# Patient Record
Sex: Female | Born: 1965 | Race: Black or African American | Hispanic: No | Marital: Single | Smoking: Never smoker
Health system: Southern US, Community
[De-identification: ages and names within clinical notes are randomized; demographics above are authoritative.]

## PROBLEM LIST (undated history)

## (undated) DIAGNOSIS — F410 Panic disorder [episodic paroxysmal anxiety] without agoraphobia: Secondary | ICD-10-CM

## (undated) HISTORY — PX: TUBAL LIGATION: SHX77

---

## 2010-08-29 ENCOUNTER — Emergency Department (INDEPENDENT_AMBULATORY_CARE_PROVIDER_SITE_OTHER): Payer: Medicaid Other

## 2010-08-29 ENCOUNTER — Encounter: Payer: Self-pay | Admitting: Family Medicine

## 2010-08-29 ENCOUNTER — Emergency Department (HOSPITAL_BASED_OUTPATIENT_CLINIC_OR_DEPARTMENT_OTHER)
Admission: EM | Admit: 2010-08-29 | Discharge: 2010-08-29 | Disposition: A | Discharge status: Home / Self Care | Payer: Medicaid Other | Attending: Emergency Medicine | Admitting: Emergency Medicine

## 2010-08-29 DIAGNOSIS — A499 Bacterial infection, unspecified: Secondary | ICD-10-CM | POA: Insufficient documentation

## 2010-08-29 DIAGNOSIS — E119 Type 2 diabetes mellitus without complications: Secondary | ICD-10-CM | POA: Insufficient documentation

## 2010-08-29 DIAGNOSIS — R109 Unspecified abdominal pain: Secondary | ICD-10-CM

## 2010-08-29 DIAGNOSIS — D259 Leiomyoma of uterus, unspecified: Secondary | ICD-10-CM

## 2010-08-29 DIAGNOSIS — B9689 Other specified bacterial agents as the cause of diseases classified elsewhere: Secondary | ICD-10-CM

## 2010-08-29 DIAGNOSIS — N760 Acute vaginitis: Secondary | ICD-10-CM | POA: Insufficient documentation

## 2010-08-29 HISTORY — DX: Panic disorder (episodic paroxysmal anxiety): F41.0

## 2010-08-29 LAB — URINALYSIS, ROUTINE W REFLEX MICROSCOPIC
Glucose, UA: 100 mg/dL — AB
Hgb urine dipstick: NEGATIVE
Ketones, ur: NEGATIVE mg/dL
Protein, ur: NEGATIVE mg/dL
pH: 6 (ref 5.0–8.0)

## 2010-08-29 LAB — WET PREP, GENITAL: Yeast Wet Prep HPF POC: NONE SEEN

## 2010-08-29 LAB — PREGNANCY, URINE: Preg Test, Ur: NEGATIVE

## 2010-08-29 MED ORDER — METRONIDAZOLE 500 MG PO TABS: 500.0000 mg | ORAL_TABLET | Freq: Two times a day (BID) | ORAL | Status: AC

## 2010-08-29 MED ORDER — HYDROCODONE-ACETAMINOPHEN 5-325 MG PO TABS: 2.0000 | ORAL_TABLET | ORAL | Status: AC | PRN

## 2010-08-29 NOTE — ED Notes (Signed)
MD at bedside. 

## 2010-08-29 NOTE — ED Notes (Signed)
Pt drove self to ED today. Pt unable to provide urine specimen at this time.

## 2010-08-29 NOTE — ED Provider Notes (Signed)
History     CSN: [id number] Arrival date & time: 08/29/2010  1:43 PM  Chief Complaint  Patient presents with  . Back Pain   Patient is a 45 y.o. female presenting with back pain and abdominal pain.  Back Pain  This is a recurrent problem. The current episode started more than 1 week ago. The problem occurs constantly. The problem has been gradually worsening. The pain is associated with no known injury. The pain is present in the lumbar spine. The quality of the pain is described as stabbing. Associated symptoms include abdominal pain.  Abdominal Pain The primary symptoms of the illness include abdominal pain. The current episode started more than 2 days ago. The onset of the illness was gradual. The problem has been rapidly worsening.  The illness is associated with recent sexual activity. The patient states that she believes she is currently not pregnant. The patient has not had a change in bowel habit. Additional symptoms associated with the illness include back pain. Symptoms associated with the illness do not include chills, anorexia, diaphoresis, heartburn or constipation. Significant associated medical issues do not include inflammatory bowel disease, diabetes, gallstones, liver disease or diverticulitis.   patient complains of pelvic pain worse with intercourse for the past month. Patient reports she has not had a Pap smear in over 3 years... she does not currently have a gynecologist. Patient has been told in the past that she has fibroids. She denies any risk of any sexually transmitted disease partner has not had any symptoms.  Past Medical History  Diagnosis Date  . Diabetes mellitus   . Panic attacks     Past Surgical History  Procedure Date  . Tubal ligation     No family history on file.  History  Substance Use Topics  . Smoking status: Never Smoker   . Smokeless tobacco: Not on file  . Alcohol Use: Yes    OB History    Grav Para Term Preterm Abortions TAB SAB Ect  Mult Living                  Review of Systems  Constitutional: Negative for chills and diaphoresis.  Gastrointestinal: Positive for abdominal pain. Negative for heartburn, constipation and anorexia.  Musculoskeletal: Positive for back pain.  All other systems reviewed and are negative.    Physical Exam  BP 124/69  Pulse 70  Temp(Src) 98.2 F (36.8 C) (Oral)  Resp 16  Ht 5\' 11"  (1.803 m)  Wt 230 lb (104.327 kg)  BMI 32.08 kg/m2  SpO2 100%  LMP 04/29/2010  Physical Exam  Constitutional: She appears well-developed and well-nourished.  HENT:  Head: Normocephalic.  Eyes: Pupils are equal, round, and reactive to light.  Neck: Normal range of motion.  Cardiovascular: Normal rate.   Pulmonary/Chest: Effort normal.  Abdominal: Soft.  Genitourinary: Vagina normal.       Uterus is enlarged there is a thick brownish yellow vaginal discharge. Cervix is tender bilateral adnexa are nontender.    ED Course  Procedures  MDM Wet prep shows many clue cells and ultrasound shows a 12 x 2 cm myometrial fibroid. I will place the patient on Flagyl and given hydrocodone for pain we'll refer the patient to gynecology for complete evaluation     Langston Masker, [place] 08/29/10 1724  Medical screening examination/treatment/procedure(s) were conducted as a shared visit with non-physician practitioner(s) and myself.  I personally evaluated the patient during the encounter   Sunnie Nielsen, MD 08/30/10 0010

## 2010-08-29 NOTE — ED Notes (Signed)
Pt c/o right lower back pain, suprapubic pain since Saturday. Pt reports hematuria on 07/28 but not since. Pt sts she went to [facility] today and was told "it's a muscle strain" and given "muscle relaxers".

## 2015-01-14 DIAGNOSIS — IMO0002 Reserved for concepts with insufficient information to code with codable children: Secondary | ICD-10-CM | POA: Diagnosis present

## 2015-05-21 DIAGNOSIS — I10 Essential (primary) hypertension: Secondary | ICD-10-CM | POA: Diagnosis present

## 2015-05-29 ENCOUNTER — Emergency Department (HOSPITAL_BASED_OUTPATIENT_CLINIC_OR_DEPARTMENT_OTHER): Payer: Medicaid Other

## 2015-05-29 ENCOUNTER — Encounter (HOSPITAL_BASED_OUTPATIENT_CLINIC_OR_DEPARTMENT_OTHER): Payer: Self-pay | Admitting: *Deleted

## 2015-05-29 ENCOUNTER — Emergency Department (HOSPITAL_BASED_OUTPATIENT_CLINIC_OR_DEPARTMENT_OTHER)
Admission: EM | Admit: 2015-05-29 | Discharge: 2015-05-30 | Disposition: A | Discharge status: Home / Self Care | Payer: Medicaid Other | Attending: Emergency Medicine | Admitting: Emergency Medicine

## 2015-05-29 DIAGNOSIS — Z7984 Long term (current) use of oral hypoglycemic drugs: Secondary | ICD-10-CM | POA: Diagnosis not present

## 2015-05-29 DIAGNOSIS — E119 Type 2 diabetes mellitus without complications: Secondary | ICD-10-CM | POA: Diagnosis not present

## 2015-05-29 DIAGNOSIS — R51 Headache: Secondary | ICD-10-CM | POA: Insufficient documentation

## 2015-05-29 DIAGNOSIS — R079 Chest pain, unspecified: Secondary | ICD-10-CM | POA: Diagnosis present

## 2015-05-29 DIAGNOSIS — R0602 Shortness of breath: Secondary | ICD-10-CM | POA: Insufficient documentation

## 2015-05-29 DIAGNOSIS — R479 Unspecified speech disturbances: Secondary | ICD-10-CM | POA: Diagnosis not present

## 2015-05-29 LAB — COMPREHENSIVE METABOLIC PANEL
ALBUMIN: 3.4 g/dL — AB (ref 3.5–5.0)
ALK PHOS: 110 U/L (ref 38–126)
ALT: 13 U/L — ABNORMAL LOW (ref 14–54)
ANION GAP: 7 (ref 5–15)
AST: 15 U/L (ref 15–41)
BILIRUBIN TOTAL: 0.5 mg/dL (ref 0.3–1.2)
BUN: 16 mg/dL (ref 6–20)
CALCIUM: 9 mg/dL (ref 8.9–10.3)
CO2: 25 mmol/L (ref 22–32)
Chloride: 105 mmol/L (ref 101–111)
Creatinine, Ser: 0.81 mg/dL (ref 0.44–1.00)
GLUCOSE: 255 mg/dL — AB (ref 65–99)
POTASSIUM: 4.1 mmol/L (ref 3.5–5.1)
Sodium: 137 mmol/L (ref 135–145)
TOTAL PROTEIN: 6.6 g/dL (ref 6.5–8.1)

## 2015-05-29 LAB — ETHANOL: Alcohol, Ethyl (B): <5 mg/dL (ref <5)

## 2015-05-29 LAB — PREGNANCY, URINE: Preg Test, Ur: NEGATIVE

## 2015-05-29 LAB — CBC
HCT: 37.8 % (ref 36.0–46.0)
HEMOGLOBIN: 12.8 g/dL (ref 12.0–15.0)
MCH: 29.5 pg (ref 26.0–34.0)
MCHC: 33.9 g/dL (ref 30.0–36.0)
MCV: 87.1 fL (ref 78.0–100.0)
Platelets: 288 10*3/uL (ref 150–400)
RBC: 4.34 MIL/uL (ref 3.87–5.11)
RDW: 11.9 % (ref 11.5–15.5)
WBC: 8.7 10*3/uL (ref 4.0–10.5)

## 2015-05-29 LAB — RAPID URINE DRUG SCREEN, HOSP PERFORMED
AMPHETAMINES: NOT DETECTED
BENZODIAZEPINES: NOT DETECTED
Barbiturates: NOT DETECTED
COCAINE: NOT DETECTED
OPIATES: NOT DETECTED
Tetrahydrocannabinol: NOT DETECTED

## 2015-05-29 LAB — DIFFERENTIAL
Basophils Absolute: 0 10*3/uL (ref 0.0–0.1)
Basophils Relative: 0 %
EOS ABS: 0.2 10*3/uL (ref 0.0–0.7)
EOS PCT: 2 %
LYMPHS ABS: 2.7 10*3/uL (ref 0.7–4.0)
LYMPHS PCT: 31 %
MONO ABS: 0.7 10*3/uL (ref 0.1–1.0)
Monocytes Relative: 8 %
NEUTROS PCT: 59 %
Neutro Abs: 5.1 10*3/uL (ref 1.7–7.7)

## 2015-05-29 LAB — URINALYSIS, ROUTINE W REFLEX MICROSCOPIC
BILIRUBIN URINE: NEGATIVE
Glucose, UA: >1000 mg/dL — AB
HGB URINE DIPSTICK: NEGATIVE
Ketones, ur: NEGATIVE mg/dL
Leukocytes, UA: NEGATIVE
Nitrite: NEGATIVE
PH: 7 (ref 5.0–8.0)
Protein, ur: NEGATIVE mg/dL
SPECIFIC GRAVITY, URINE: 1.024 (ref 1.005–1.030)

## 2015-05-29 LAB — TROPONIN I: Troponin I: <0.03 ng/mL (ref <0.031)

## 2015-05-29 LAB — PROTIME-INR
INR: 0.96 (ref 0.00–1.49)
Prothrombin Time: 13 seconds (ref 11.6–15.2)

## 2015-05-29 LAB — URINE MICROSCOPIC-ADD ON: RBC / HPF: NONE SEEN RBC/hpf (ref 0–5)

## 2015-05-29 LAB — APTT: APTT: 28 s (ref 24–37)

## 2015-05-29 LAB — CBG MONITORING, ED: GLUCOSE-CAPILLARY: 201 mg/dL — AB (ref 65–99)

## 2015-05-29 MED ORDER — ACETAMINOPHEN 325 MG PO TABS
650.0000 mg | ORAL_TABLET | Freq: Once | ORAL | Status: AC
  Administered 2015-05-29: 650 mg via ORAL
  Filled 2015-05-29: qty 2

## 2015-05-29 NOTE — ED Notes (Addendum)
Sister states  c/o CP, slurred speech, last seen normal sat.

## 2015-05-29 NOTE — ED Notes (Signed)
Nurse first-pt assisted from car-was able to stand slowly and sit in w/c-adult female friend is with pt-pt speaking in slow exaggerated speech with friend states is not her normal-pt c/o SOB and CP x 4 hours-O2 sat 100%- HR 74- RR 18

## 2015-05-29 NOTE — ED Provider Notes (Signed)
CSN: [id number]     Arrival date & time 05/29/15  2018 History  By signing my name below, I, [name], attest that this documentation has been prepared under the direction and in the presence of physician practitioner, Sherwood Gambler, MD. Electronically Signed: [name], Scribe. 05/29/2015. 8:44 PM.     Chief Complaint  Patient presents with  . Chest Pain    The history is provided by the patient and a relative. No language interpreter was used.     HPI Comments: Brianna Wood is a 50 y.o. female brought in by her sister with h/o panic attacks who presents to the Emergency Department complaining of sudden onset, constant, severe, worsening, middle chest pain beginning this morning. Per her sister, pt notes associated slow and slurred speech, SOB, and pain in the back of her head that feels like pressure. Pt's sister saw her this evening and pt was in her current condition. She endorses chest pain exacerbation with palpation, talking, and arm raising. Per her sister, pt said that she felt like she was going to die and feels scared. Pt was last seen 4 days ago and was normal per her sister. Per her sister, pt was recently admitted at [facility] for similar, but worse, symptoms. Pt denies abdominal pain or any other associated symptoms.  Past Medical History  Diagnosis Date  . Diabetes mellitus   . Panic attacks    Past Surgical History  Procedure Laterality Date  . Tubal ligation     History reviewed. No pertinent family history. Social History  Substance Use Topics  . Smoking status: Never Smoker   . Smokeless tobacco: None  . Alcohol Use: Yes   OB History    No data available     Review of Systems  Respiratory: Positive for shortness of breath.   Cardiovascular: Positive for chest pain.  Gastrointestinal: Negative for abdominal pain.  Neurological: Positive for speech difficulty (slow, slurred) and headaches.  All other systems reviewed and are  negative.   Allergies  Aspirin  Home Medications   Prior to Admission medications   Medication Sig Start Date End Date Taking? Authorizing Provider  BusPIRone HCl (BUSPAR PO) Take by mouth as needed.      Historical Provider, MD  metFORMIN (GLUMETZA) 500 MG (MOD) 24 hr tablet Take 500 mg by mouth daily with breakfast.      Historical Provider, MD   BP 148/98 mmHg  Pulse 74  Temp(Src) 98.1 F (36.7 C)  Resp 16  Ht 5\' 10"  (1.778 m)  Wt 220 lb (99.791 kg)  BMI 31.57 kg/m2  SpO2 100% Physical Exam  Constitutional: She is oriented to person, place, and time. She appears well-developed and well-nourished. No distress.  HENT:  Head: Normocephalic and atraumatic.  Right Ear: External ear normal.  Left Ear: External ear normal.  Nose: Nose normal.  Eyes: EOM are normal. Pupils are equal, round, and reactive to light. Right eye exhibits no discharge. Left eye exhibits no discharge.  Neck: Normal range of motion. Neck supple.  Cardiovascular: Normal rate, regular rhythm and normal heart sounds.   Pulmonary/Chest: Effort normal and breath sounds normal. She exhibits tenderness (mid-chest).  Abdominal: Soft. She exhibits no distension. There is no tenderness.  Musculoskeletal: She exhibits no edema.  Neurological: She is alert and oriented to person, place, and time.  Speaks very slowly but is not slurring words. No facial droop. Diffusely weak but this seems effort related. The more she is coached up  the more she can move her arms and legs. She is able to sit up and remain sitting with no truncal ataxia  Skin: Skin is warm and dry. She is not diaphoretic.  Nursing note and vitals reviewed.   ED Course  Procedures (including critical care time)  DIAGNOSTIC STUDIES: Oxygen Saturation is 100% on RA, normal by my interpretation.    COORDINATION OF CARE: 8:48 PM Discussed treatment plan with pt at bedside and pt agreed to plan.  Labs Review Labs Reviewed  COMPREHENSIVE METABOLIC  PANEL - Abnormal; Notable for the following:    Glucose, Bld 255 (*)    Albumin 3.4 (*)    ALT 13 (*)    All other components within normal limits  CBG MONITORING, ED - Abnormal; Notable for the following:    Glucose-Capillary 201 (*)    All other components within normal limits  ETHANOL  PROTIME-INR  APTT  CBC  DIFFERENTIAL  TROPONIN I  URINE RAPID DRUG SCREEN, HOSP PERFORMED  URINALYSIS, ROUTINE W REFLEX MICROSCOPIC (NOT AT [facility])  PREGNANCY, URINE    Imaging Review Dg Chest 2 View  05/29/2015  CLINICAL DATA:  Midchest pain for several days. EXAM: CHEST  2 VIEW COMPARISON:  05/20/2015 FINDINGS: The lungs are clear. The pulmonary vasculature is normal. Heart size is normal. Hilar and mediastinal contours are unremarkable. There is no pleural effusion. IMPRESSION: No active cardiopulmonary disease. Electronically Signed   By: Andreas Newport M.D.   On: 05/29/2015 21:54   Ct Head Wo Contrast  05/29/2015  CLINICAL DATA:  50 year old with headache and exceedingly slow speech pattern over the past 10 days. EXAM: CT HEAD WITHOUT CONTRAST TECHNIQUE: Contiguous axial images were obtained from the base of the skull through the vertex without intravenous contrast. COMPARISON:  CT head 05/20/2015, 05/20/2010. MRI brain 05/08/2013, 05/06/2013. FINDINGS: Motion blurred many of the images initially. These were repeated with persistent motion. The study is therefore less than optimal though appears diagnostic. Ventricular system normal in size and appearance for age. No mass lesion. No midline shift. No acute hemorrhage or hematoma. No extra-axial fluid collections. No evidence of acute infarction. No focal brain parenchymal abnormality. No significant interval change. No skull fracture or other focal osseous abnormality involving the skull. Visualized paranasal sinuses, bilateral mastoid air cells and bilateral middle ear cavities well-aerated. IMPRESSION: Motion degraded examination demonstrates no acute  intracranial abnormality. Electronically Signed   By: Evangeline Dakin M.D.   On: 05/29/2015 21:55   I have personally reviewed and evaluated these images and lab results as part of my medical decision-making.   EKG Interpretation   Date/Time:  Wednesday May 29 2015 20:40:39 EDT Ventricular Rate:  69 PR Interval:  189 QRS Duration: 83 QT Interval:  386 QTC Calculation: 413 R Axis:   49 Text Interpretation:  Sinus rhythm Normal ECG No old tracing to compare  Confirmed by Nyimah Shadduck MD, Mars Scheaffer [phone]) on 05/29/2015 9:08:31 PM       MRI BRAIN HEAD WO CONTRAST4/25/2017  [facility]  Result Impression  IMPRESSION:  1. No acute intracranial abnormality. No evidence of acute infarct. 2. Small discrete white matter lesions are identified within the cerebral hemispheres. These lesions are nonspecific, usually resulting from benign/remote/incidental causes (e.g. prior trauma/inflammation/demyelination, or chronic ischemia associated  with migraine/atherosclerosis/other vasculopathies).   CT Chest Abdomen Pelvis W Contrast4/25/2017  [facility]  Result Impression  IMPRESSION:  1. Minimal bibasilar atelectasis 2. Small nonobstructing stone lower pole of the right kidney.   2D Echo 05/21/2015  Interpretation Summary The study was technically difficult.  Ejection Fraction = >55%. The left ventricular wall motion is normal. There is mild mitral regurgitation. There is mild tricuspid regurgitation.  MDM   Final diagnoses:  Chest pain, unspecified chest pain type  Speech abnormality    Patient's symptoms appear to be psychiatric in nature. There are no focal neuro deficits. I extensively reviewed her past chart in care everywhere and she was admitted about one week ago for very similar speech difficulty and chest pain. She had a CT scan that was negative and essentially negative MRI. She also had a CT chest abdomen pelvis that was negative. Her chest pain is quite reproducible. There  is no slurred speech, only the exaggerated slow speech. She was also seen on 5/1 for similar chest pain and slowed speech. Negative CT head then as well. I have very low suspicion this is ACS. Not consistent with PE with no hypoxia, increase work of breathing, or tachycardia. Recently had the negative CT chest abdomen pelvis and thus I think dissection is very unlikely as well. Patient tells me that she has a stress test tomorrow and a neurology follow-up in 2 days. She denies suicidal or homicidal thoughts. While I think this is psychiatric in nature, she does not meet any type of IVC criteria and I think she is stable for outpatient workup. She was able to ambulate in the ED without assistance.  I personally performed the services described in this documentation, which was scribed in my presence. The recorded information has been reviewed and is accurate.   Sherwood Gambler, MD 05/29/15 [phone]

## 2015-05-30 NOTE — ED Notes (Signed)
ambulated from bed to hall and pt gait steady with minimal assist

## 2015-08-29 DIAGNOSIS — I639 Cerebral infarction, unspecified: Secondary | ICD-10-CM | POA: Diagnosis present

## 2016-03-02 ENCOUNTER — Emergency Department (HOSPITAL_BASED_OUTPATIENT_CLINIC_OR_DEPARTMENT_OTHER)
Admission: EM | Admit: 2016-03-02 | Discharge: 2016-03-02 | Disposition: A | Discharge status: Home / Self Care | Payer: Medicaid Other | Attending: Emergency Medicine | Admitting: Emergency Medicine

## 2016-03-02 ENCOUNTER — Encounter (HOSPITAL_BASED_OUTPATIENT_CLINIC_OR_DEPARTMENT_OTHER): Payer: Self-pay | Admitting: *Deleted

## 2016-03-02 ENCOUNTER — Emergency Department (HOSPITAL_BASED_OUTPATIENT_CLINIC_OR_DEPARTMENT_OTHER): Payer: Medicaid Other

## 2016-03-02 DIAGNOSIS — M79604 Pain in right leg: Secondary | ICD-10-CM | POA: Diagnosis present

## 2016-03-02 DIAGNOSIS — Z7984 Long term (current) use of oral hypoglycemic drugs: Secondary | ICD-10-CM | POA: Diagnosis not present

## 2016-03-02 DIAGNOSIS — R739 Hyperglycemia, unspecified: Secondary | ICD-10-CM

## 2016-03-02 DIAGNOSIS — Z7901 Long term (current) use of anticoagulants: Secondary | ICD-10-CM | POA: Diagnosis not present

## 2016-03-02 DIAGNOSIS — E1165 Type 2 diabetes mellitus with hyperglycemia: Secondary | ICD-10-CM | POA: Diagnosis not present

## 2016-03-02 LAB — URINALYSIS, MICROSCOPIC (REFLEX): RBC / HPF: NONE SEEN RBC/hpf (ref 0–5)

## 2016-03-02 LAB — COMPREHENSIVE METABOLIC PANEL
ALT: 19 U/L (ref 14–54)
ANION GAP: 8 (ref 5–15)
AST: 19 U/L (ref 15–41)
Albumin: 4 g/dL (ref 3.5–5.0)
Alkaline Phosphatase: 120 U/L (ref 38–126)
BILIRUBIN TOTAL: 0.6 mg/dL (ref 0.3–1.2)
BUN: 17 mg/dL (ref 6–20)
CALCIUM: 9.6 mg/dL (ref 8.9–10.3)
CO2: 26 mmol/L (ref 22–32)
Chloride: 103 mmol/L (ref 101–111)
Creatinine, Ser: 0.81 mg/dL (ref 0.44–1.00)
GFR calc non Af Amer: >60 mL/min (ref >60)
Glucose, Bld: 265 mg/dL — ABNORMAL HIGH (ref 65–99)
POTASSIUM: 4 mmol/L (ref 3.5–5.1)
Sodium: 137 mmol/L (ref 135–145)
TOTAL PROTEIN: 7.6 g/dL (ref 6.5–8.1)

## 2016-03-02 LAB — URINALYSIS, ROUTINE W REFLEX MICROSCOPIC
BILIRUBIN URINE: NEGATIVE
Glucose, UA: >=500 mg/dL — AB
HGB URINE DIPSTICK: NEGATIVE
KETONES UR: NEGATIVE mg/dL
Leukocytes, UA: NEGATIVE
Nitrite: NEGATIVE
PROTEIN: NEGATIVE mg/dL
Specific Gravity, Urine: 1.028 (ref 1.005–1.030)
pH: 6 (ref 5.0–8.0)

## 2016-03-02 LAB — CBC WITH DIFFERENTIAL/PLATELET
Basophils Absolute: 0 10*3/uL (ref 0.0–0.1)
Basophils Relative: 0 %
EOS ABS: 0.1 10*3/uL (ref 0.0–0.7)
Eosinophils Relative: 2 %
HCT: 40.1 % (ref 36.0–46.0)
HEMOGLOBIN: 13.6 g/dL (ref 12.0–15.0)
LYMPHS ABS: 2.8 10*3/uL (ref 0.7–4.0)
Lymphocytes Relative: 30 %
MCH: 29.3 pg (ref 26.0–34.0)
MCHC: 33.9 g/dL (ref 30.0–36.0)
MCV: 86.4 fL (ref 78.0–100.0)
MONO ABS: 0.6 10*3/uL (ref 0.1–1.0)
MONOS PCT: 7 %
NEUTROS ABS: 5.8 10*3/uL (ref 1.7–7.7)
NEUTROS PCT: 61 %
Platelets: 303 10*3/uL (ref 150–400)
RBC: 4.64 MIL/uL (ref 3.87–5.11)
RDW: 12.9 % (ref 11.5–15.5)
WBC: 9.4 10*3/uL (ref 4.0–10.5)

## 2016-03-02 LAB — PROTIME-INR
INR: 0.92
PROTHROMBIN TIME: 12.4 s (ref 11.4–15.2)

## 2016-03-02 LAB — CBG MONITORING, ED: Glucose-Capillary: 242 mg/dL — ABNORMAL HIGH (ref 65–99)

## 2016-03-02 MED ORDER — FUROSEMIDE 20 MG PO TABS: 20.0000 mg | ORAL_TABLET | Freq: Every day | ORAL | 0 refills | Status: AC

## 2016-03-02 MED ORDER — GLIMEPIRIDE 4 MG PO TABS: 4.0000 mg | ORAL_TABLET | Freq: Every day | ORAL | 0 refills | Status: DC

## 2016-03-02 MED ORDER — TRAMADOL HCL 50 MG PO TABS: 50.0000 mg | ORAL_TABLET | Freq: Four times a day (QID) | ORAL | 0 refills | Status: AC | PRN

## 2016-03-02 MED FILL — FUROSEMIDE 20 MG TABLET: 20 | 2 days supply | Qty: 2 | Fill #0

## 2016-03-02 MED FILL — GLIMEPIRIDE 4 MG TABLET: 4 | 30 days supply | Qty: 30 | Fill #0

## 2016-03-02 MED FILL — traMADol HCL 50 MG TABS: 50 | 2 days supply | Qty: 8 | Fill #0

## 2016-03-02 NOTE — ED Provider Notes (Signed)
Brianna Wood DEPT [facility] Provider Note   CSN: [id number] Arrival date & time: 03/02/16  [id number]   By signing my name below, I, Brianna Wood, attest that this documentation has been prepared under the direction and in the presence of Brianna Freeze, Wood  Electronically Signed: Delton Wood, ED Scribe. 03/02/16. 3:08 PM.   History   Chief Complaint Chief Complaint  Patient presents with  . Leg Pain   The history is provided by the patient. No language interpreter was used.   HPI Comments:  Brianna Wood is a 51 y.o. female, with a hx of DM on glipizide and metformin and stroke on 04/2015, who presents to the Emergency Department complaining of moderate bilateral lower leg pain, from her knees down, onset 2 days. She states her right leg pain is worse than her left leg and describes her pain as a tightness. No alleviating factors noted. Pt denies chest pain, SOB or any other associated symptoms. Patient was on coumadin before but has been off of it for several months. She is on plavix after her stroke last year   PCP: Brianna Kid, Wood  Past Medical History:  Diagnosis Date  . Diabetes mellitus   . Panic attacks     There are no active problems to display for this patient.   Past Surgical History:  Procedure Laterality Date  . TUBAL LIGATION      OB History    No data available       Home Medications    Prior to Admission medications   Medication Sig Start Date End Date Taking? Authorizing Provider  GLIPIZIDE PO Take by mouth.   Yes Historical Provider, Wood  methocarbamol (ROBAXIN) 500 MG tablet Take 500 mg by mouth 4 (four) times daily.   Yes Historical Provider, Wood  Warfarin Sodium (COUMADIN PO) Take by mouth.   Yes Historical Provider, Wood  BusPIRone HCl (BUSPAR PO) Take by mouth as needed.      Historical Provider, Wood  metFORMIN (GLUMETZA) 500 MG (MOD) 24 hr tablet Take 500 mg by mouth daily with breakfast.      Historical Provider, Wood    Family History History  reviewed. No pertinent family history.  Social History Social History  Substance Use Topics  . Smoking status: Never Smoker  . Smokeless tobacco: Never Used  . Alcohol use Yes     Allergies   Aspirin   Review of Systems Review of Systems  Respiratory: Negative for shortness of breath.   Cardiovascular: Negative for chest pain.  Musculoskeletal: Positive for myalgias.  All other systems reviewed and are negative.    Physical Exam Updated Vital Signs BP 127/90 (BP Location: Left Arm)   Pulse 65   Temp 98.3 F (36.8 C) (Oral)   Resp 16   Ht 5\' 10"  (1.778 m)   Wt 230 lb (104.3 kg)   LMP 04/29/2010   SpO2 97%   BMI 33.00 kg/m   Physical Exam  Constitutional: She is oriented to person, place, and time. She appears well-developed and well-nourished. No distress.  HENT:  Head: Normocephalic and atraumatic.  Eyes: EOM are normal.  Neck: Normal range of motion.  Cardiovascular: Normal rate, regular rhythm and normal heart sounds.   Pulmonary/Chest: Effort normal and breath sounds normal.  Abdominal: Soft. She exhibits no distension. There is no tenderness.  Musculoskeletal: Normal range of motion. She exhibits tenderness. She exhibits no deformity.  Mild bilateral calf tenderness. Good pulses 2+ bilaterally, no obvious deformity. Trace edema  bilaterally   Neurological: She is alert and oriented to person, place, and time.  Normal strength throughout   Skin: Skin is warm and dry.  Psychiatric: She has a normal mood and affect. Judgment normal.  Nursing note and vitals reviewed.    ED Treatments / Results  DIAGNOSTIC STUDIES:  Oxygen Saturation is 97% on RA, normal by my interpretation.    COORDINATION OF CARE:  3:06 PM Discussed treatment plan with pt at bedside and pt agreed to plan.  Labs (all labs ordered are listed, but only abnormal results are displayed) Labs Reviewed  CBG MONITORING, ED - Abnormal; Notable for the following:       Result Value    Glucose-Capillary 242 (*)    All other components within normal limits  CBC WITH DIFFERENTIAL/PLATELET  COMPREHENSIVE METABOLIC PANEL  PROTIME-INR    EKG  EKG Interpretation  Date/Time:  Monday March 02 2016 13:50:07 EST Ventricular Rate:  60 PR Interval:    QRS Duration: 87 QT Interval:  408 QTC Calculation: 408 R Axis:   31 Text Interpretation:  Sinus rhythm No significant change since last tracing Confirmed by Brianna Wood, Brianna Wood [id number]) on 03/02/2016 2:08:45 PM       Radiology No results found.  Procedures Procedures (including critical care time)  Medications Ordered in ED Medications - No data to display   Initial Impression / Assessment and Plan / ED Course  I have reviewed the triage vital signs and the nursing notes.  Pertinent labs & imaging results that were available during my care of the patient were reviewed by me and considered in my medical decision making (see chart for details).     [name] is a 51 y.o. female here with bilateral calf pain, some swelling. She was on coumadin but has been off of it. Consider DVT vs diabetic neuropathy. Will get DVT study, labs.   5:28 PM Labs showed glucose 265, nl AG. US showed no DVT. Likely has neuropathy. She is taking metformin 1000 mg BID. Will increase glipimeride. Will give tramadol for pain, several doss of lasix for swelling.    Final Clinical Impressions(s) / ED Diagnoses   Final diagnoses:  None    New Prescriptions New Prescriptions   No medications on file  I personally performed the services described in this documentation, which was scribed in my presence. The recorded information has been reviewed and is accurate.    Brianna Freeze, Wood 03/02/16 [phone]

## 2016-03-02 NOTE — Discharge Instructions (Signed)
Take meformin as prescribed.   Increase glimepiride to 4 mg daily .  Take tylenol for pain, take tramadol for severe pain.   Take lasix 20 mg daily x 2 doses to help with some foot swelling.   See your doctor this week to check your blood sugar  Return to ER if you have worse leg pain, swelling, chest pain, trouble breathing

## 2016-03-02 NOTE — ED Triage Notes (Signed)
Pt amb to registration with quick steady gait in nad, requests w/c for triage, reports bilateral pain to both legs x 1 week, pt states she takes coumadin for "small blood vessel disease" and worries they are blood clots. No edema noted at triage. Pt states her pain is a "tightness" from her knees to her feet. Pt reports she injured her right foot and wonders if she is favoring it and causing her other leg to hurt.

## 2016-03-17 DIAGNOSIS — I6990 Unspecified sequelae of unspecified cerebrovascular disease: Secondary | ICD-10-CM

## 2017-06-17 DIAGNOSIS — Z91199 Patient's noncompliance with other medical treatment and regimen due to unspecified reason: Secondary | ICD-10-CM

## 2017-09-26 IMAGING — US US EXTREM LOW VENOUS BILAT
1 series · 13 of 24 positions shown · non-contrast
Comparison: None.

CLINICAL DATA: Bilateral lower extremity pain below the knees.



[Series 1: us extrem low venous bilat · 0.09mm/px · 13 of 48 slices shown]
[im 1/48]
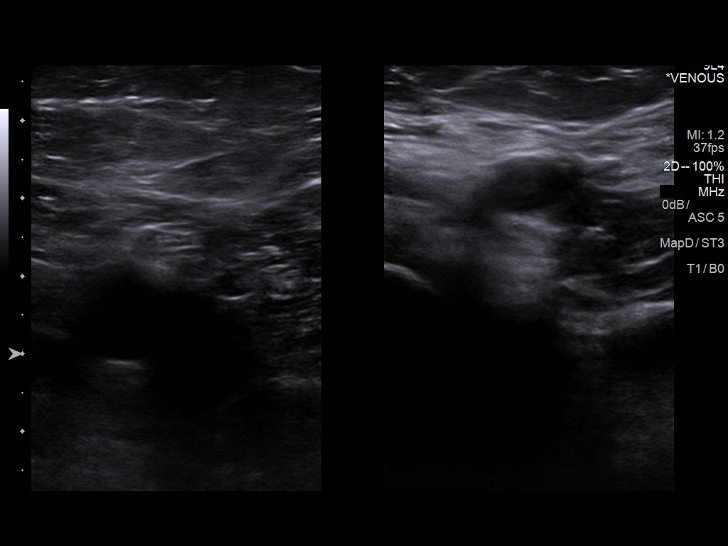
[im 5/48]
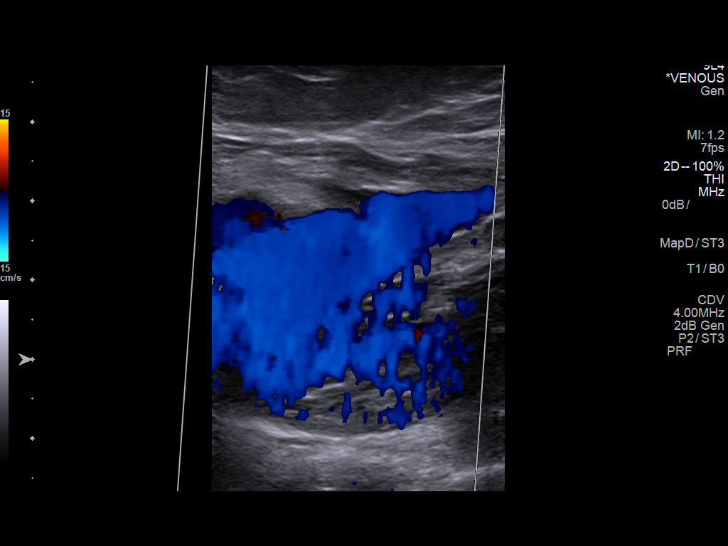
[im 9/48]
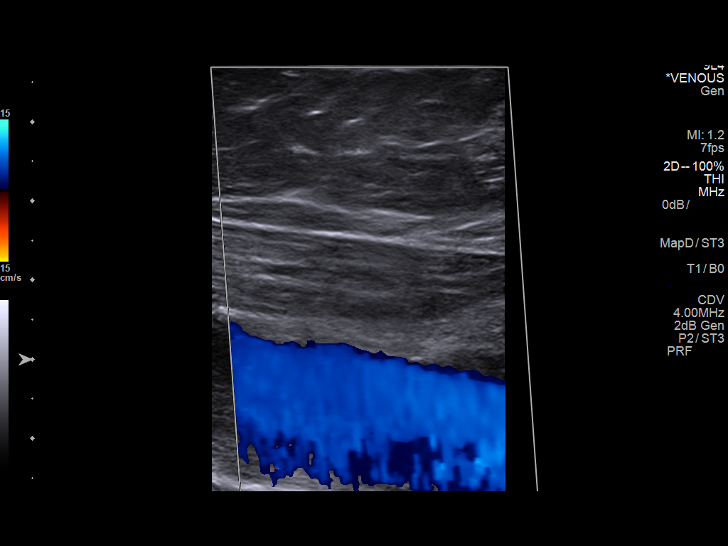
[im 13/48]
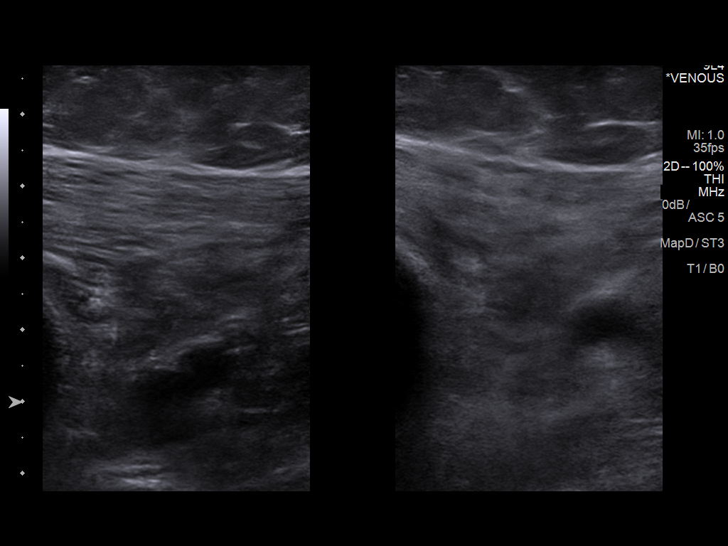
[im 17/48]
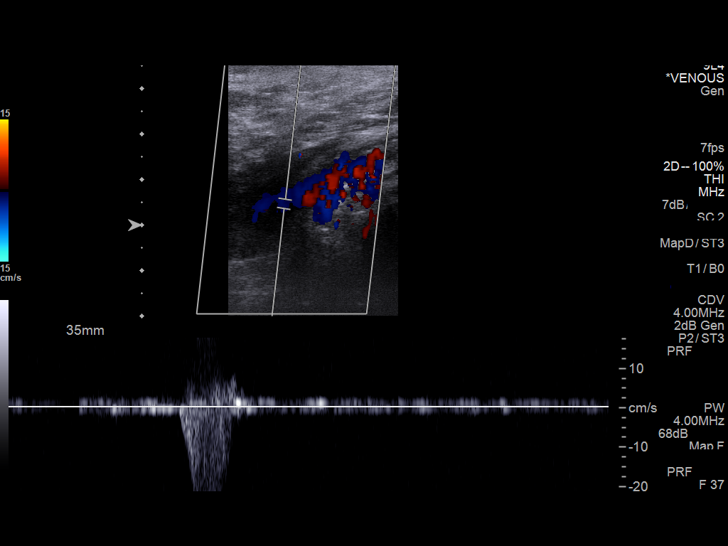
[im 21/48]
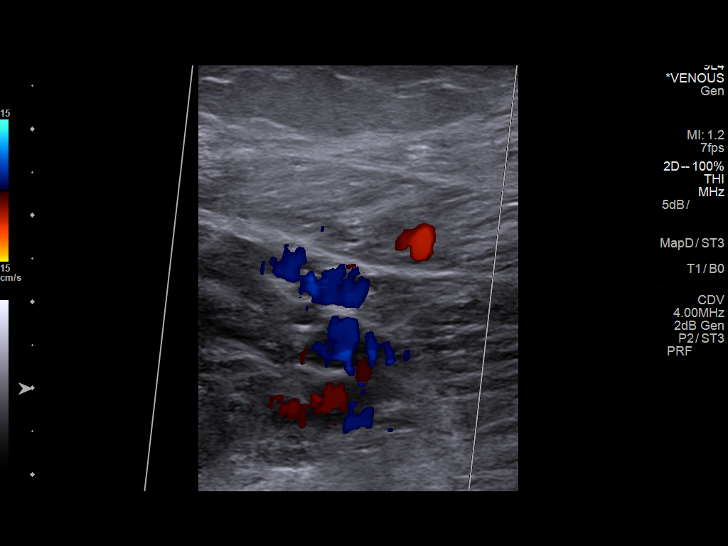
[im 25/48]
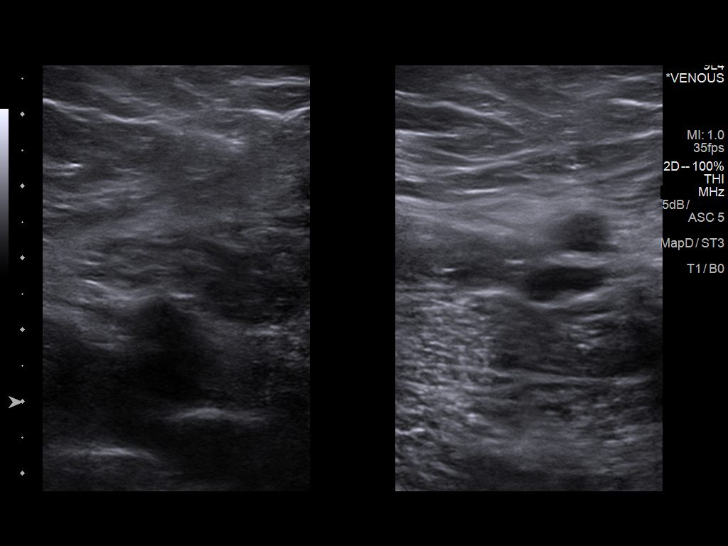
[im 27/48]
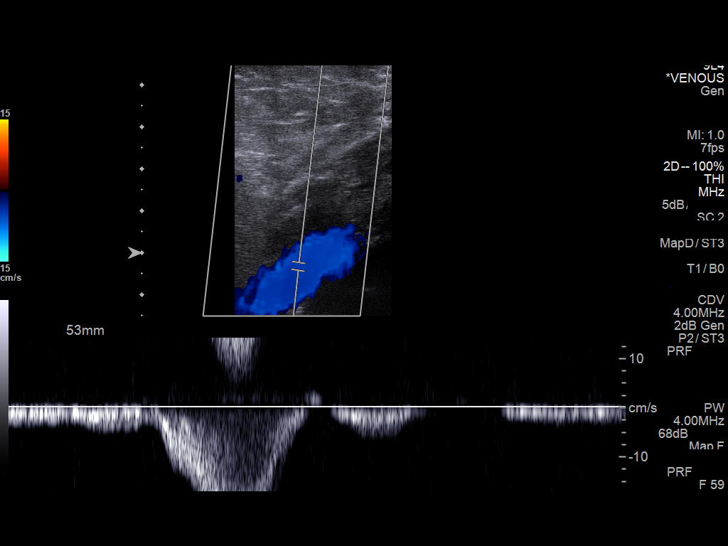
[im 31/48]
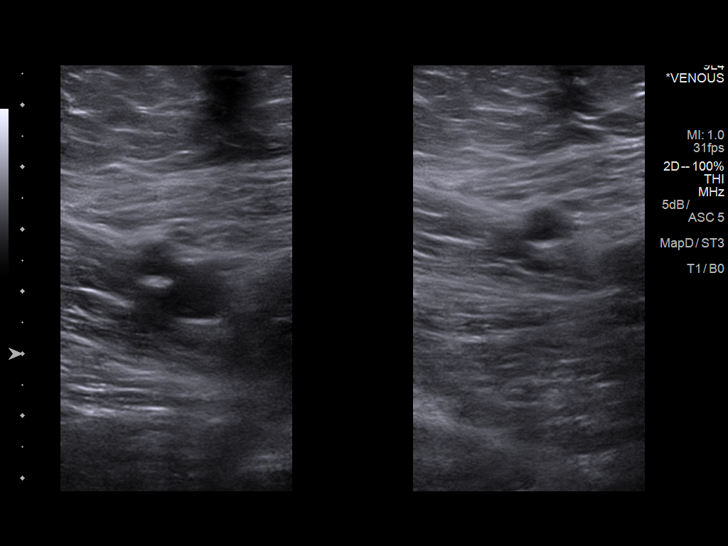
[im 35/48]
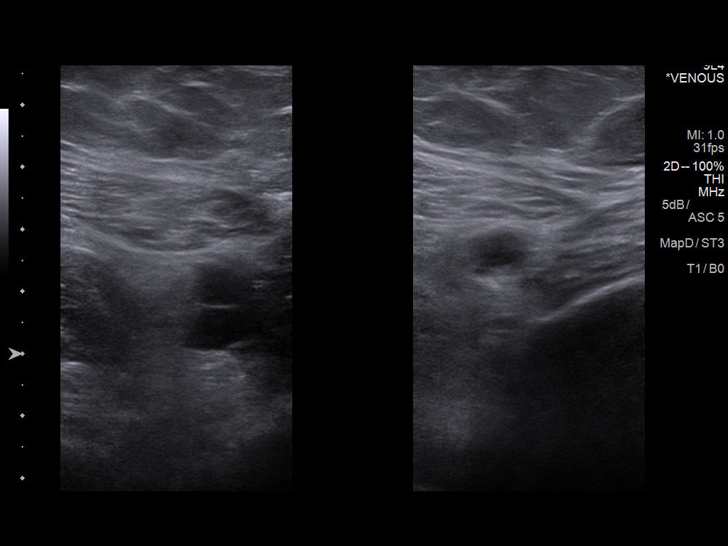
[im 39/48]
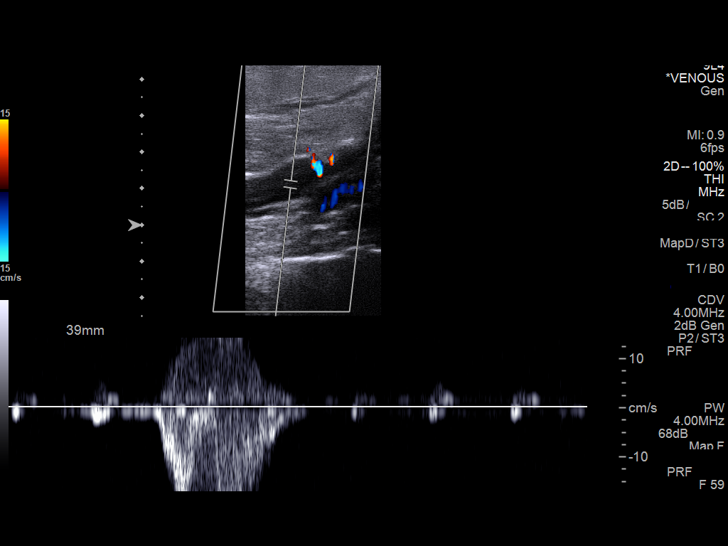
[im 43/48]
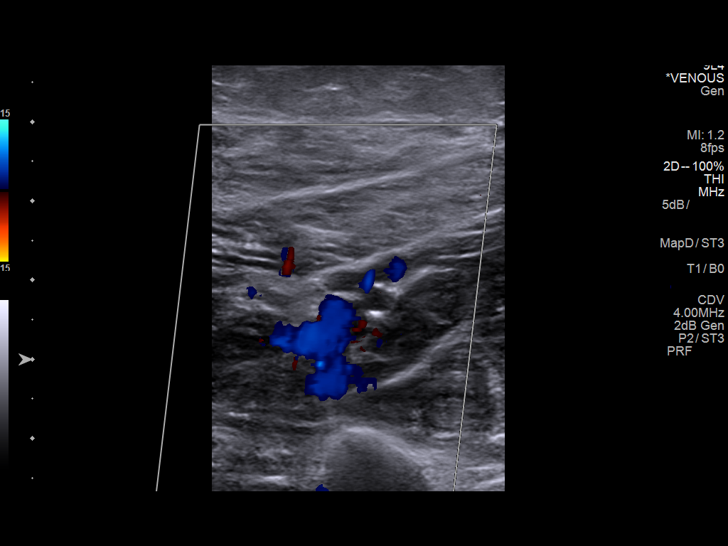
[im 48/48]
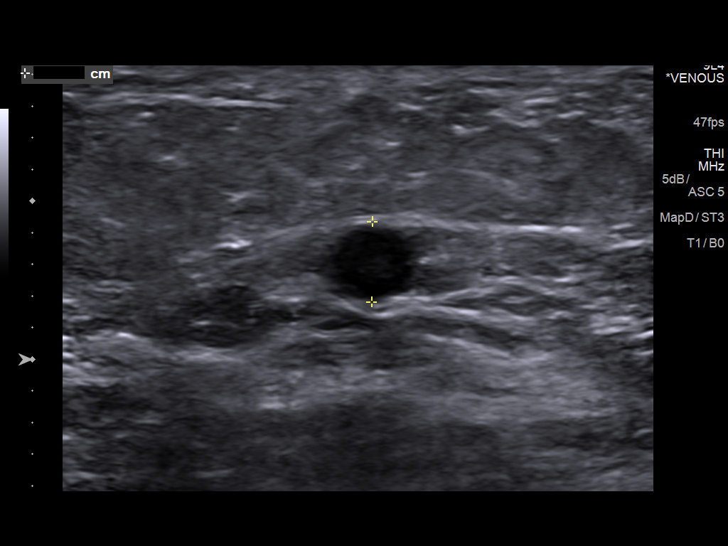

[13 of 24 positions shown; findings below may reference images not displayed]

FINDINGS: RIGHT LOWER EXTREMITY

Common Femoral Vein: No evidence of thrombus. Normal
compressibility, respiratory phasicity and response to augmentation.

Saphenofemoral Junction: No evidence of thrombus. Normal
compressibility and flow on color Doppler imaging.

Profunda Femoral Vein: No evidence of thrombus. Normal
compressibility and flow on color Doppler imaging.

Femoral Vein: No evidence of thrombus. Normal compressibility,
respiratory phasicity and response to augmentation.

Popliteal Vein: No evidence of thrombus. Normal compressibility,
respiratory phasicity and response to augmentation.

Calf Veins: No evidence of thrombus. Normal compressibility and flow
on color Doppler imaging.

Superficial Great Saphenous Vein: No evidence of thrombus. Normal
compressibility.

LEFT LOWER EXTREMITY

Common Femoral Vein: No evidence of thrombus. Normal
compressibility, respiratory phasicity and response to augmentation.

Saphenofemoral Junction: No evidence of thrombus. Normal
compressibility and flow on color Doppler imaging.

Profunda Femoral Vein: No evidence of thrombus. Normal
compressibility and flow on color Doppler imaging.

Femoral Vein: No evidence of thrombus. Normal compressibility,
respiratory phasicity and response to augmentation.

Popliteal Vein: No evidence of thrombus. Normal compressibility,
respiratory phasicity and response to augmentation.

Calf Veins: No evidence of thrombus. Normal compressibility and flow
on color Doppler imaging.

Superficial Great Saphenous Vein: No evidence of thrombus. Normal
compressibility.
IMPRESSION: No evidence of deep venous thrombosis in the lower extremities.

## 2017-12-09 DIAGNOSIS — G894 Chronic pain syndrome: Secondary | ICD-10-CM | POA: Diagnosis present

## 2019-05-28 DIAGNOSIS — F32A Depression, unspecified: Secondary | ICD-10-CM | POA: Diagnosis present

## 2019-05-28 DIAGNOSIS — F419 Anxiety disorder, unspecified: Secondary | ICD-10-CM | POA: Diagnosis present

## 2021-08-05 ENCOUNTER — Encounter (HOSPITAL_BASED_OUTPATIENT_CLINIC_OR_DEPARTMENT_OTHER): Payer: Self-pay

## 2021-08-05 ENCOUNTER — Emergency Department (HOSPITAL_BASED_OUTPATIENT_CLINIC_OR_DEPARTMENT_OTHER): Payer: Medicaid Other

## 2021-08-05 ENCOUNTER — Other Ambulatory Visit: Payer: Self-pay

## 2021-08-05 ENCOUNTER — Observation Stay (HOSPITAL_BASED_OUTPATIENT_CLINIC_OR_DEPARTMENT_OTHER)
Admission: EM | Admit: 2021-08-05 | Discharge: 2021-08-07 | Disposition: A | Discharge status: Home Health | Payer: Medicaid Other | Attending: Student | Admitting: Student

## 2021-08-05 DIAGNOSIS — Z7984 Long term (current) use of oral hypoglycemic drugs: Secondary | ICD-10-CM | POA: Diagnosis not present

## 2021-08-05 DIAGNOSIS — E1165 Type 2 diabetes mellitus with hyperglycemia: Secondary | ICD-10-CM | POA: Diagnosis not present

## 2021-08-05 DIAGNOSIS — Z91040 Latex allergy status: Secondary | ICD-10-CM | POA: Diagnosis not present

## 2021-08-05 DIAGNOSIS — Z794 Long term (current) use of insulin: Secondary | ICD-10-CM | POA: Insufficient documentation

## 2021-08-05 DIAGNOSIS — R55 Syncope and collapse: Secondary | ICD-10-CM | POA: Diagnosis present

## 2021-08-05 DIAGNOSIS — I6990 Unspecified sequelae of unspecified cerebrovascular disease: Secondary | ICD-10-CM

## 2021-08-05 DIAGNOSIS — R4020 Unspecified coma: Secondary | ICD-10-CM | POA: Diagnosis present

## 2021-08-05 DIAGNOSIS — Z8673 Personal history of transient ischemic attack (TIA), and cerebral infarction without residual deficits: Secondary | ICD-10-CM | POA: Insufficient documentation

## 2021-08-05 DIAGNOSIS — Z7901 Long term (current) use of anticoagulants: Secondary | ICD-10-CM | POA: Insufficient documentation

## 2021-08-05 DIAGNOSIS — R410 Disorientation, unspecified: Secondary | ICD-10-CM | POA: Diagnosis not present

## 2021-08-05 DIAGNOSIS — E1142 Type 2 diabetes mellitus with diabetic polyneuropathy: Secondary | ICD-10-CM | POA: Diagnosis not present

## 2021-08-05 DIAGNOSIS — I10 Essential (primary) hypertension: Secondary | ICD-10-CM | POA: Diagnosis not present

## 2021-08-05 DIAGNOSIS — F32A Depression, unspecified: Secondary | ICD-10-CM | POA: Diagnosis present

## 2021-08-05 DIAGNOSIS — G894 Chronic pain syndrome: Secondary | ICD-10-CM | POA: Diagnosis present

## 2021-08-05 DIAGNOSIS — Z86718 Personal history of other venous thrombosis and embolism: Secondary | ICD-10-CM | POA: Insufficient documentation

## 2021-08-05 DIAGNOSIS — IMO0002 Reserved for concepts with insufficient information to code with codable children: Secondary | ICD-10-CM | POA: Diagnosis present

## 2021-08-05 DIAGNOSIS — Z91199 Patient's noncompliance with other medical treatment and regimen due to unspecified reason: Secondary | ICD-10-CM

## 2021-08-05 DIAGNOSIS — F419 Anxiety disorder, unspecified: Secondary | ICD-10-CM | POA: Diagnosis present

## 2021-08-05 DIAGNOSIS — Z79899 Other long term (current) drug therapy: Secondary | ICD-10-CM | POA: Diagnosis not present

## 2021-08-05 DIAGNOSIS — I639 Cerebral infarction, unspecified: Secondary | ICD-10-CM | POA: Diagnosis present

## 2021-08-05 DIAGNOSIS — G9341 Metabolic encephalopathy: Secondary | ICD-10-CM | POA: Insufficient documentation

## 2021-08-05 DIAGNOSIS — R202 Paresthesia of skin: Secondary | ICD-10-CM

## 2021-08-05 LAB — URINALYSIS, ROUTINE W REFLEX MICROSCOPIC
Bilirubin Urine: NEGATIVE
Glucose, UA: >=500 mg/dL — AB
Hgb urine dipstick: NEGATIVE
Ketones, ur: NEGATIVE mg/dL
Leukocytes,Ua: NEGATIVE
Nitrite: NEGATIVE
Protein, ur: NEGATIVE mg/dL
Specific Gravity, Urine: 1.01 (ref 1.005–1.030)
pH: 5.5 (ref 5.0–8.0)

## 2021-08-05 LAB — HEMOGLOBIN A1C
Hgb A1c MFr Bld: 14.2 % — ABNORMAL HIGH (ref 4.8–5.6)
Mean Plasma Glucose: 360.84 mg/dL

## 2021-08-05 LAB — CBC WITH DIFFERENTIAL/PLATELET
Abs Immature Granulocytes: 0.02 10*3/uL (ref 0.00–0.07)
Basophils Absolute: 0 10*3/uL (ref 0.0–0.1)
Basophils Relative: 0 %
Eosinophils Absolute: 0.1 10*3/uL (ref 0.0–0.5)
Eosinophils Relative: 2 %
HCT: 35.8 % — ABNORMAL LOW (ref 36.0–46.0)
Hemoglobin: 12.1 g/dL (ref 12.0–15.0)
Immature Granulocytes: 0 %
Lymphocytes Relative: 22 %
Lymphs Abs: 1.7 10*3/uL (ref 0.7–4.0)
MCH: 29.4 pg (ref 26.0–34.0)
MCHC: 33.8 g/dL (ref 30.0–36.0)
MCV: 87.1 fL (ref 80.0–100.0)
Monocytes Absolute: 0.5 10*3/uL (ref 0.1–1.0)
Monocytes Relative: 6 %
Neutro Abs: 5.3 10*3/uL (ref 1.7–7.7)
Neutrophils Relative %: 70 %
Platelets: 219 10*3/uL (ref 150–400)
RBC: 4.11 MIL/uL (ref 3.87–5.11)
RDW: 12.4 % (ref 11.5–15.5)
WBC: 7.6 10*3/uL (ref 4.0–10.5)
nRBC: 0 % (ref 0.0–0.2)

## 2021-08-05 LAB — CBG MONITORING, ED
Glucose-Capillary: 403 mg/dL — ABNORMAL HIGH (ref 70–99)
Glucose-Capillary: 266 mg/dL — ABNORMAL HIGH (ref 70–99)
Glucose-Capillary: 221 mg/dL — ABNORMAL HIGH (ref 70–99)
Glucose-Capillary: 216 mg/dL — ABNORMAL HIGH (ref 70–99)

## 2021-08-05 LAB — COMPREHENSIVE METABOLIC PANEL
ALT: 16 U/L (ref 0–44)
AST: 17 U/L (ref 15–41)
Albumin: 3.4 g/dL — ABNORMAL LOW (ref 3.5–5.0)
Alkaline Phosphatase: 113 U/L (ref 38–126)
Anion gap: 5 (ref 5–15)
BUN: 15 mg/dL (ref 6–20)
CO2: 25 mmol/L (ref 22–32)
Calcium: 8.8 mg/dL — ABNORMAL LOW (ref 8.9–10.3)
Chloride: 102 mmol/L (ref 98–111)
Creatinine, Ser: 0.9 mg/dL (ref 0.44–1.00)
GFR, Estimated: >60 mL/min (ref >60)
Glucose, Bld: 390 mg/dL — ABNORMAL HIGH (ref 70–99)
Potassium: 4.3 mmol/L (ref 3.5–5.1)
Sodium: 132 mmol/L — ABNORMAL LOW (ref 135–145)
Total Bilirubin: 0.6 mg/dL (ref 0.3–1.2)
Total Protein: 6.5 g/dL (ref 6.5–8.1)

## 2021-08-05 LAB — I-STAT VENOUS BLOOD GAS, ED
Acid-Base Excess: 2 mmol/L (ref 0.0–2.0)
Bicarbonate: 26 mmol/L (ref 20.0–28.0)
Calcium, Ion: 1.13 mmol/L — ABNORMAL LOW (ref 1.15–1.40)
HCT: 37 % (ref 36.0–46.0)
Hemoglobin: 12.6 g/dL (ref 12.0–15.0)
O2 Saturation: 100 %
Potassium: 4.8 mmol/L (ref 3.5–5.1)
Sodium: 132 mmol/L — ABNORMAL LOW (ref 135–145)
TCO2: 27 mmol/L (ref 22–32)
pCO2, Ven: 38.7 mmHg — ABNORMAL LOW (ref 44–60)
pH, Ven: 7.435 — ABNORMAL HIGH (ref 7.25–7.43)
pO2, Ven: 162 mmHg — ABNORMAL HIGH (ref 32–45)

## 2021-08-05 LAB — RAPID URINE DRUG SCREEN, HOSP PERFORMED
Amphetamines: NOT DETECTED
Barbiturates: NOT DETECTED
Benzodiazepines: NOT DETECTED
Cocaine: NOT DETECTED
Opiates: NOT DETECTED
Tetrahydrocannabinol: NOT DETECTED

## 2021-08-05 LAB — TROPONIN I (HIGH SENSITIVITY)
Troponin I (High Sensitivity): 4 ng/L (ref <18)
Troponin I (High Sensitivity): 3 ng/L (ref <18)

## 2021-08-05 LAB — URINALYSIS, MICROSCOPIC (REFLEX)

## 2021-08-05 LAB — PREGNANCY, URINE: Preg Test, Ur: NEGATIVE

## 2021-08-05 LAB — MAGNESIUM: Magnesium: 1.9 mg/dL (ref 1.7–2.4)

## 2021-08-05 MED ORDER — INSULIN ASPART 100 UNIT/ML IJ SOLN
0.0000 [IU] | Freq: Three times a day (TID) | INTRAMUSCULAR | Status: DC
  Administered 2021-08-06 (×3): 3 [IU] via SUBCUTANEOUS

## 2021-08-05 MED ORDER — LACTATED RINGERS IV BOLUS
1000.0000 mL | Freq: Once | INTRAVENOUS | Status: AC
  Administered 2021-08-05: 1000 mL via INTRAVENOUS

## 2021-08-05 MED ORDER — IOHEXOL 350 MG/ML SOLN
100.0000 mL | Freq: Once | INTRAVENOUS | Status: AC | PRN
  Administered 2021-08-05: 100 mL via INTRAVENOUS

## 2021-08-05 MED ORDER — INSULIN ASPART 100 UNIT/ML IJ SOLN
3.0000 [IU] | Freq: Three times a day (TID) | INTRAMUSCULAR | Status: DC
Start: 2021-08-05 — End: 2021-08-06
  Administered 2021-08-06 (×3): 3 [IU] via SUBCUTANEOUS

## 2021-08-05 MED ORDER — LORAZEPAM 2 MG/ML IJ SOLN
0.5000 mg | Freq: Once | INTRAMUSCULAR | Status: AC
  Administered 2021-08-05: 0.5 mg via INTRAVENOUS
  Filled 2021-08-05: qty 1

## 2021-08-05 MED ORDER — INSULIN ASPART 100 UNIT/ML IJ SOLN
0.0000 [IU] | Freq: Every day | INTRAMUSCULAR | Status: DC
  Administered 2021-08-05: 2 [IU] via SUBCUTANEOUS

## 2021-08-05 NOTE — ED Notes (Signed)
Before Ativan IV administered, client seem to be unsure of orientation to location and time of day. Oriented to name, unsure of time and place. Also questioned when the MD would be in to see her. Attempted to reorient client and explain plan of care.

## 2021-08-05 NOTE — ED Notes (Signed)
When trying to raise legs and plantar and dorsal flexion has a lot of groin pain bilateraly

## 2021-08-05 NOTE — ED Notes (Signed)
Keep patients door open Patient very confused

## 2021-08-05 NOTE — ED Notes (Signed)
HIGH FALL RISK CLIENT, door open to room, sr x 2 up, bed in lowest position, call bell within easy reach.

## 2021-08-05 NOTE — ED Notes (Signed)
States she has "blacked out" three times lately, first on Sunday was driving and briefly loss consciousness, yesterday was in the shower had episode, yesterday evening when retrieving something from refrigerator, bent over and when standing up had episode again. Today, at work, walking to refrigerator area and felt "nervousness, trembling"  Currently has nausea and feels nervous

## 2021-08-05 NOTE — ED Provider Notes (Signed)
[facility] EMERGENCY DEPARTMENT Provider Note   CSN: [id number] Arrival date & time: 08/05/21  1009     History  Chief Complaint  Patient presents with   Loss of Consciousness    Brianna Wood is a 56 y.o. female.  HPI     56 year old female with a history of diabetes, anxiety, CVA, presents with concern for   Reports at about 530 this morning at work she did not feel right and had an episode where she forgot where she was(She denies syncopal events where she falls--but reports she slumped over onto counter).  Later around 930AM she was at work turning and walking she again had an episode where she forgot where she was and felt dizzy and "blacked out".  She now feels like she has significant pain to her left side.    A feeling of being lost, feeling of the fear of the unknown Everything dark and dizzy Passed out while driving in car, and couldn't see and was still awake, then it came right back, pulled into [place], said couldn't see, that everything going blurry.    "I'm trying to explain it but I can't make sense of it.  I'm trying to explain it but I don't known how."  6 episodes of this  Last night just walked out of shower into the den and before got to the den in the hallway "blacked out", has never fallen to the ground.  Does not remember.  Twice yesterday was looking in the refrigerator for fruit or something to eat and it happened again.   With these spells can't esplain what happens, that everything goes dark, feeling she gets before hand like a fear, then blurry vision, then dark, and when come back to for a minute will have to figure it out then feels better/alright again> Each time is feeling a little worse.   Feels forgetting, disorientation, when comes to is leaning, it lasts for a few seconds. Has had head pressure Initially denies chest pain but now having pressure Nausea   Toes feel like they are being grabbed for a few months/duct tape.   Sometimes left side feels heavy.  Feels like someone holding her down.  Is not sure if she feels weakness.  Living with issues for so long.    Had previously seen Neurology "anxiety disorder, headache, syncope, left leg weakness"   No cough, no fever Pressure with urination Pain with moving arms/legs but not weakness, does note numbness and tingling on the left that began here but did not feel herself starting at 530AM. Denies vision changes, speech changes History is challenging    Past Medical History:  Diagnosis Date   Diabetes mellitus    Panic attacks     Past Surgical History:  Procedure Laterality Date   TUBAL LIGATION       Home Medications Prior to Admission medications   Medication Sig Start Date End Date Taking? Authorizing Provider  BusPIRone HCl (BUSPAR PO) Take by mouth as needed.      [provider]  furosemide (LASIX) 20 MG tablet Take 1 tablet (20 mg total) by mouth daily. 03/02/16   Drenda Freeze, MD  glimepiride (AMARYL) 4 MG tablet Take 1 tablet (4 mg total) by mouth daily with breakfast. 03/02/16   Drenda Freeze, MD  GLIPIZIDE PO Take by mouth.    [provider]  metFORMIN (GLUMETZA) 500 MG (MOD) 24 hr tablet Take 500 mg by mouth daily with breakfast.  [provider]  methocarbamol (ROBAXIN) 500 MG tablet Take 500 mg by mouth 4 (four) times daily.    [provider]  traMADol (ULTRAM) 50 MG tablet Take 1 tablet (50 mg total) by mouth every 6 (six) hours as needed. 03/02/16   Drenda Freeze, MD  Warfarin Sodium (COUMADIN PO) Take by mouth.    [provider]      Allergies    Nickel, Aspirin, Latex, and Wheat extract    Review of Systems   Review of Systems  Physical Exam Updated Vital Signs BP (!) 155/92   Pulse 67   Temp 98.1 F (36.7 C) (Oral)   Resp 16   Ht '5\' 10"'$  (1.778 m)   Wt 90.7 kg   LMP 04/29/2010   SpO2 96%   BMI 28.70 kg/m  Physical Exam Vitals and nursing note reviewed.   Constitutional:      General: She is not in acute distress.    Appearance: She is well-developed. She is not diaphoretic.  HENT:     Head: Normocephalic and atraumatic.  Eyes:     Conjunctiva/sclera: Conjunctivae normal.  Cardiovascular:     Rate and Rhythm: Normal rate and regular rhythm.     Heart sounds: Normal heart sounds. No murmur heard.    No friction rub. No gallop.  Pulmonary:     Effort: Pulmonary effort is normal. No respiratory distress.     Breath sounds: Normal breath sounds. No wheezing or rales.  Abdominal:     General: There is no distension.     Palpations: Abdomen is soft.     Tenderness: There is no abdominal tenderness. There is no guarding.  Musculoskeletal:        General: No tenderness.     Cervical back: Normal range of motion.  Skin:    General: Skin is warm and dry.     Findings: No erythema or rash.  Neurological:     Mental Status: She is alert and oriented to person, place, and time.     Cranial Nerves: No cranial nerve deficit.     Sensory: Sensory deficit (left side feels different than other side arm and leg, face) present.     Motor: Weakness (generalized, left weak but with pain iwth movement, new pain) present.     ED Results / Procedures / Treatments   Labs (all labs ordered are listed, but only abnormal results are displayed) Labs Reviewed  URINALYSIS, ROUTINE W REFLEX MICROSCOPIC - Abnormal; Notable for the following components:      Result Value   Glucose, UA >=500 (*)    All other components within normal limits  CBC WITH DIFFERENTIAL/PLATELET - Abnormal; Notable for the following components:   HCT 35.8 (*)    All other components within normal limits  COMPREHENSIVE METABOLIC PANEL - Abnormal; Notable for the following components:   Sodium 132 (*)    Glucose, Bld 390 (*)    Calcium 8.8 (*)    Albumin 3.4 (*)    All other components within normal limits  URINALYSIS, MICROSCOPIC (REFLEX) - Abnormal; Notable for the following  components:   Bacteria, UA RARE (*)    All other components within normal limits  HEMOGLOBIN A1C - Abnormal; Notable for the following components:   Hgb A1c MFr Bld 14.2 (*)    All other components within normal limits  CBG MONITORING, ED - Abnormal; Notable for the following components:   Glucose-Capillary 403 (*)    All other components  within normal limits  CBG MONITORING, ED - Abnormal; Notable for the following components:   Glucose-Capillary 266 (*)    All other components within normal limits  I-STAT VENOUS BLOOD GAS, ED - Abnormal; Notable for the following components:   pH, Ven 7.435 (*)    pCO2, Ven 38.7 (*)    pO2, Ven 162 (*)    Sodium 132 (*)    Calcium, Ion 1.13 (*)    All other components within normal limits  CBG MONITORING, ED - Abnormal; Notable for the following components:   Glucose-Capillary 221 (*)    All other components within normal limits  CBG MONITORING, ED - Abnormal; Notable for the following components:   Glucose-Capillary 216 (*)    All other components within normal limits  PREGNANCY, URINE  RAPID URINE DRUG SCREEN, HOSP PERFORMED  MAGNESIUM  TROPONIN I (HIGH SENSITIVITY)  TROPONIN I (HIGH SENSITIVITY)    EKG EKG Interpretation  Date/Time:  Tuesday August 05 2021 10:31:58 EDT Ventricular Rate:  80 PR Interval:  160 QRS Duration: 84 QT Interval:  374 QTC Calculation: 431 R Axis:   47 Text Interpretation: Normal sinus rhythm Nonspecific T wave abnormality Abnormal ECG When compared with ECG of 02-Mar-2016 13:50, No significant change since last tracing Confirmed by Gareth Morgan [phone]) on 08/05/2021 4:15:56 PM  Radiology DG Chest Portable 1 View  Result Date: 08/05/2021 CLINICAL DATA:  Chest pain EXAM: PORTABLE CHEST 1 VIEW COMPARISON:  05/29/2015 FINDINGS: The heart size and mediastinal contours are within normal limits. Both lungs are clear. The visualized skeletal structures are unremarkable. IMPRESSION: No active disease.  Electronically Signed   By: Elmer Picker M.D.   On: 08/05/2021 16:34   CT ANGIO HEAD NECK W WO CM  Result Date: 08/05/2021 CLINICAL DATA:  Numbness or tingling, paresthesia EXAM: CT ANGIOGRAPHY HEAD AND NECK TECHNIQUE: Multidetector CT imaging of the head and neck was performed using the standard protocol during bolus administration of intravenous contrast. Multiplanar CT image reconstructions and MIPs were obtained to evaluate the vascular anatomy. Carotid stenosis measurements (when applicable) are obtained utilizing NASCET criteria, using the distal internal carotid diameter as the denominator. RADIATION DOSE REDUCTION: This exam was performed according to the departmental dose-optimization program which includes automated exposure control, adjustment of the mA and/or kV according to patient size and/or use of iterative reconstruction technique. CONTRAST:  186m OMNIPAQUE IOHEXOL 350 MG/ML SOLN COMPARISON:  2017 FINDINGS: CT HEAD Brain: There is no acute intracranial hemorrhage, mass effect, or edema. Gray-white differentiation is preserved. There is no extra-axial fluid collection. Ventricles and sulci are within normal limits in size and configuration. Age-indeterminate but probably chronic small left cerebellar infarct Vascular: Better evaluated on CTA portion. Skull: Calvarium is unremarkable. Sinuses/Orbits: No acute finding. Other: None. Review of the MIP images confirms the above findings CTA NECK Aortic arch: Minimal calcified plaque. Great vessel origins are patent. Right carotid system: Patent. Mild eccentric noncalcified plaque at the ICA origin without stenosis. Left carotid system: Patent. Trace plaque at the ICA origin without stenosis. Vertebral arteries: Patent. Left vertebral is dominant. No stenosis. Skeleton: No acute or significant osseous abnormality. Other neck: Unremarkable. Upper chest: No apical lung mass. Review of the MIP images confirms the above findings CTA HEAD Anterior  circulation: Intracranial internal carotid arteries patent with mild plaque. Anterior and middle cerebral arteries are patent. Posterior circulation: Intracranial vertebral arteries are patent. Basilar artery is patent. Major cerebellar artery origins are patent. Bilateral posterior communicating arteries are present. Posterior cerebral arteries  are patent. Venous sinuses: Patent as allowed by contrast bolus timing. Review of the MIP images confirms the above findings IMPRESSION: No acute intracranial abnormality. 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 No large vessel occlusion, hemodynamically significant stenosis, or evidence of dissection. Electronically Signed   By: Macy Mis M.D.   On: 08/05/2021 12:48    Procedures Procedures    Medications Ordered in ED Medications  insulin aspart (novoLOG) injection 0-5 Units (has no administration in time range)  insulin aspart (novoLOG) injection 3 Units (3 Units Subcutaneous Not Given 08/05/21 1732)  insulin aspart (novoLOG) injection 0-9 Units ( Subcutaneous Not Given 08/05/21 1733)  LORazepam (ATIVAN) injection 0.5 mg (0.5 mg Intravenous Given 08/05/21 1136)  lactated ringers bolus 1,000 mL (0 mLs Intravenous Stopped 08/05/21 1307)  iohexol (OMNIPAQUE) 350 MG/ML injection 100 mL (100 mLs Intravenous Contrast Given 08/05/21 1220)    ED Course/ Medical Decision Making/ A&P                           Medical Decision Making Amount and/or Complexity of Data Reviewed Labs: ordered. Radiology: ordered.  Risk Prescription drug management. Decision regarding hospitalization.   56 year old female with a history of diabetes, anxiety, CVA, presents with concern for 6 episodes of "blacking out" and paresthesias to left side.  She has a difficult time describing her symptoms, just doesn't feel right, but does describe 6 episodes--consider syncope versus seizure versus electrolyte abnormality/HHS/PRES/other.  Describes some  paresthesias with pain on the left side as well on exam---has normal pulses, no sign of acute arterial thrombus, doubt dissection. Describes other symptoms beginning 530 and given these symptoms outside of 4.5 hr, did not call code stroke. Did evaluate for LVO or ICH or aneurysm with CTA head and neck . CTA evaluated by me personally without acute findings.     Labs completed and personally evaluated by me show hyperglycemia without signs of acidosis. No other significant electrolyte abnormalities. CBC shows no sign of anemia. UA without infection. Pregnancy test negative.  EKG without significant changes.  Initially denied chest pain, then does state she has had chest pressure. Troponin negative, doubt ACS. Doubt dissection, PE.  Has noted left sided pain over the last several years/lumbar abnormalities, LLE pain.   Glucose 221 after receiving fluids.    Will admit for further evaluation of syncope vs seizure and further evaluation of paresthesias.           Final Clinical Impression(s) / ED Diagnoses Final diagnoses:  None    Rx / DC Orders ED Discharge Orders     None         Gareth Morgan, MD 08/05/21 2142

## 2021-08-05 NOTE — ED Notes (Signed)
ED Provider at bedside. 

## 2021-08-05 NOTE — ED Notes (Signed)
Checked CBG 216, RN Gina informed

## 2021-08-05 NOTE — ED Triage Notes (Signed)
Pt states she fell and hit her head 2 weeks ago. Today has had multiple episodes of syncope. States feels "slow". VAN negative  States she has a hx of stroke, states weakness of left side at baseline.

## 2021-08-05 NOTE — ED Notes (Signed)
Urine Spec obtained and to the lab

## 2021-08-05 NOTE — ED Notes (Signed)
ED PA @ bedside due to [facility] assessment

## 2021-08-05 NOTE — ED Notes (Signed)
Pt appears more sleepy since arrival to ED, medicated per orders, safety measures in place, sr x 2 up, door to room left open for constant observation. Pt noted to repeat information that was given earlier, also noted to now need to repeat instructions or explain care for pt to understand plan of care currently in place. Client informed to let staff know when she can void for urine spec

## 2021-08-05 NOTE — ED Notes (Signed)
B= wnl, E = wnl, F - wnl, A - wnl, S - wnl, T - Sunday Very weal left leg movement

## 2021-08-05 NOTE — Plan of Care (Signed)
Called by Dr Billy Fischer, [facility] for a direct admission.   56 y/o female with self described episodes of unresponsiveness who is a challenging historian. She described 6 black outs. She did not feel right at work at 5:30 AM. She had a feeling fear, doom and confusion . shefound herself leaning over a counter later around 9:30 without knowing how she got there. Glucose in 400s, BP high. In ED complained of left sided paraesthesias. CTA head and neck w and w/o contrast negative. [facility] does not ave the capability for MRI.  According to med history she has received 1 L LR. Repeat glucose 266 per chart.  Place in obs in telemetry bed. Have ordered neurochecks Q 4 and an MRI.  Dr Billy Fischer has ordered a diet and insulin.  Debbe Odea, MD

## 2021-08-06 ENCOUNTER — Observation Stay (HOSPITAL_COMMUNITY): Payer: Medicaid Other

## 2021-08-06 DIAGNOSIS — Z91040 Latex allergy status: Secondary | ICD-10-CM | POA: Diagnosis not present

## 2021-08-06 DIAGNOSIS — I10 Essential (primary) hypertension: Secondary | ICD-10-CM

## 2021-08-06 DIAGNOSIS — F419 Anxiety disorder, unspecified: Secondary | ICD-10-CM | POA: Diagnosis not present

## 2021-08-06 DIAGNOSIS — R4020 Unspecified coma: Secondary | ICD-10-CM | POA: Diagnosis present

## 2021-08-06 DIAGNOSIS — G9341 Metabolic encephalopathy: Secondary | ICD-10-CM | POA: Diagnosis not present

## 2021-08-06 DIAGNOSIS — R410 Disorientation, unspecified: Secondary | ICD-10-CM

## 2021-08-06 DIAGNOSIS — R55 Syncope and collapse: Secondary | ICD-10-CM | POA: Diagnosis present

## 2021-08-06 DIAGNOSIS — Z91199 Patient's noncompliance with other medical treatment and regimen due to unspecified reason: Secondary | ICD-10-CM

## 2021-08-06 DIAGNOSIS — E1142 Type 2 diabetes mellitus with diabetic polyneuropathy: Secondary | ICD-10-CM | POA: Diagnosis not present

## 2021-08-06 DIAGNOSIS — Z8673 Personal history of transient ischemic attack (TIA), and cerebral infarction without residual deficits: Secondary | ICD-10-CM | POA: Diagnosis not present

## 2021-08-06 DIAGNOSIS — Z794 Long term (current) use of insulin: Secondary | ICD-10-CM | POA: Diagnosis not present

## 2021-08-06 DIAGNOSIS — F32A Depression, unspecified: Secondary | ICD-10-CM

## 2021-08-06 DIAGNOSIS — E1165 Type 2 diabetes mellitus with hyperglycemia: Secondary | ICD-10-CM | POA: Diagnosis not present

## 2021-08-06 DIAGNOSIS — G894 Chronic pain syndrome: Secondary | ICD-10-CM | POA: Diagnosis not present

## 2021-08-06 DIAGNOSIS — I639 Cerebral infarction, unspecified: Secondary | ICD-10-CM | POA: Diagnosis not present

## 2021-08-06 DIAGNOSIS — Z7984 Long term (current) use of oral hypoglycemic drugs: Secondary | ICD-10-CM | POA: Diagnosis not present

## 2021-08-06 DIAGNOSIS — Z86718 Personal history of other venous thrombosis and embolism: Secondary | ICD-10-CM | POA: Diagnosis not present

## 2021-08-06 DIAGNOSIS — Z79899 Other long term (current) drug therapy: Secondary | ICD-10-CM | POA: Diagnosis not present

## 2021-08-06 DIAGNOSIS — R569 Unspecified convulsions: Secondary | ICD-10-CM | POA: Diagnosis not present

## 2021-08-06 DIAGNOSIS — I6990 Unspecified sequelae of unspecified cerebrovascular disease: Secondary | ICD-10-CM

## 2021-08-06 DIAGNOSIS — Z7901 Long term (current) use of anticoagulants: Secondary | ICD-10-CM | POA: Diagnosis not present

## 2021-08-06 LAB — CBC
HCT: 37.1 % (ref 36.0–46.0)
Hemoglobin: 13.4 g/dL (ref 12.0–15.0)
MCH: 31.1 pg (ref 26.0–34.0)
MCHC: 36.1 g/dL — ABNORMAL HIGH (ref 30.0–36.0)
MCV: 86.1 fL (ref 80.0–100.0)
Platelets: 252 10*3/uL (ref 150–400)
RBC: 4.31 MIL/uL (ref 3.87–5.11)
RDW: 12.3 % (ref 11.5–15.5)
WBC: 7.9 10*3/uL (ref 4.0–10.5)
nRBC: 0 % (ref 0.0–0.2)

## 2021-08-06 LAB — TSH: TSH: 1.016 u[IU]/mL (ref 0.350–4.500)

## 2021-08-06 LAB — GLUCOSE, CAPILLARY
Glucose-Capillary: 210 mg/dL — ABNORMAL HIGH (ref 70–99)
Glucose-Capillary: 265 mg/dL — ABNORMAL HIGH (ref 70–99)
Glucose-Capillary: 230 mg/dL — ABNORMAL HIGH (ref 70–99)
Glucose-Capillary: 196 mg/dL — ABNORMAL HIGH (ref 70–99)

## 2021-08-06 LAB — VITAMIN B12: Vitamin B-12: 284 pg/mL (ref 180–914)

## 2021-08-06 LAB — PROTIME-INR
INR: 1 (ref 0.8–1.2)
Prothrombin Time: 12.7 seconds (ref 11.4–15.2)

## 2021-08-06 LAB — CREATININE, SERUM
Creatinine, Ser: 0.74 mg/dL (ref 0.44–1.00)
GFR, Estimated: >60 mL/min (ref >60)

## 2021-08-06 LAB — C-REACTIVE PROTEIN: CRP: 0.6 mg/dL (ref <1.0)

## 2021-08-06 LAB — SEDIMENTATION RATE: Sed Rate: 12 mm/hr (ref 0–22)

## 2021-08-06 LAB — HIV ANTIBODY (ROUTINE TESTING W REFLEX): HIV Screen 4th Generation wRfx: NONREACTIVE

## 2021-08-06 MED ORDER — ACETAMINOPHEN 325 MG PO TABS
650.0000 mg | ORAL_TABLET | Freq: Four times a day (QID) | ORAL | Status: DC | PRN
  Administered 2021-08-07 (×2): 650 mg via ORAL
  Filled 2021-08-06 (×3): qty 2

## 2021-08-06 MED ORDER — ENOXAPARIN SODIUM 40 MG/0.4ML IJ SOSY
40.0000 mg | PREFILLED_SYRINGE | INTRAMUSCULAR | Status: DC
  Administered 2021-08-06: 40 mg via SUBCUTANEOUS
  Filled 2021-08-06: qty 0.4

## 2021-08-06 MED ORDER — INSULIN ASPART 100 UNIT/ML IJ SOLN: 0.0000 [IU] | Freq: Every day | INTRAMUSCULAR | Status: DC

## 2021-08-06 MED ORDER — INSULIN ASPART 100 UNIT/ML IJ SOLN: 4.0000 [IU] | Freq: Three times a day (TID) | INTRAMUSCULAR | Status: DC

## 2021-08-06 MED ORDER — INSULIN ASPART 100 UNIT/ML IJ SOLN
0.0000 [IU] | Freq: Three times a day (TID) | INTRAMUSCULAR | Status: DC
  Administered 2021-08-07: 8 [IU] via SUBCUTANEOUS

## 2021-08-06 MED ORDER — ENOXAPARIN SODIUM 40 MG/0.4ML IJ SOSY: 40.0000 mg | PREFILLED_SYRINGE | INTRAMUSCULAR | Status: DC

## 2021-08-06 MED ORDER — ORAL CARE MOUTH RINSE: 15.0000 mL | OROMUCOSAL | Status: DC | PRN

## 2021-08-06 MED ORDER — METHOCARBAMOL 500 MG PO TABS: 500.0000 mg | ORAL_TABLET | Freq: Three times a day (TID) | ORAL | Status: DC | PRN

## 2021-08-06 MED ORDER — SODIUM CHLORIDE 0.9 % IV SOLN: INTRAVENOUS | Status: AC

## 2021-08-06 NOTE — Progress Notes (Signed)
Received by [facility] from [facility].  Oriented to room and surroundings.  Admission Patient Handbook provided that details rights, responsibilities and visitor guidelines.

## 2021-08-06 NOTE — Evaluation (Signed)
Physical Therapy Evaluation Patient Details Name: Brianna Wood MRN: [id number] DOB: Jun 19, 1965 Today's Date: 08/06/2021  History of Present Illness  56 y.o. female presents to [facility] hospital on 08/05/2021 with confusion. Pt reports little sleep since March along with multiple syncopal episodes. EEG WNL. PMH includes anxiety, depression, somatization disorder, DMII, diabetic polyneuropathy, HLD, R CVA, DVT.  Clinical Impression  Pt presents to PT with deficits in activity tolerance, balance, gait. Pt reports significant recent stressors including her housing situation, her son's medical conditions, and recently restarting work. Pt reports a headache along with dizziness when mobilizing, along with a significant fear of falling. Pt currently benefits from UE support to improve confidence when mobility. PT encourages pt to mobilize frequently in an effort to improve confidence and subsequently reduce falls risk. Acute PT will continue to follow, PT recommends discharge home with HHPT when medically ready.      Recommendations for follow up therapy are one component of a multi-disciplinary discharge planning process, led by the attending physician.  Recommendations may be updated based on patient status, additional functional criteria and insurance authorization.  Follow Up Recommendations Home health PT      Assistance Recommended at Discharge Intermittent Supervision/Assistance  Patient can return home with the following  A little help with walking and/or transfers;A little help with bathing/dressing/bathroom;Assistance with cooking/housework;Assist for transportation;Help with stairs or ramp for entrance    Equipment Recommendations Rolling walker (2 wheels) (may progress to no needs)  Recommendations for Other Services       Functional Status Assessment Patient has had a recent decline in their functional status and demonstrates the ability to make significant improvements in function in a  reasonable and predictable amount of time.     Precautions / Restrictions Precautions Precautions: Fall Precaution Comments: dizziness, HA Restrictions Weight Bearing Restrictions: No      Mobility  Bed Mobility Overal bed mobility: Modified Independent                  Transfers Overall transfer level: Needs assistance Equipment used: None Transfers: Sit to/from Stand Sit to Stand: Min guard                Ambulation/Gait Ambulation/Gait assistance: Min guard Gait Distance (Feet): 15 Feet (15' x 2 to and from bathroom. Pt declines further ambulation due to fatigue) Assistive device: 1 person hand held assist Gait Pattern/deviations: Step-to pattern Gait velocity: reduced Gait velocity interpretation: <1.31 ft/sec, indicative of household ambulator   General Gait Details: pt with slowed step-to gait, reduced step-length bilaterally, high guard when without hand hold  Stairs            Wheelchair Mobility    Modified Rankin (Stroke Patients Only)       Balance Overall balance assessment: Needs assistance Sitting-balance support: No upper extremity supported, Feet supported Sitting balance-Leahy Scale: Good     Standing balance support: No upper extremity supported, During functional activity Standing balance-Leahy Scale: Good Standing balance comment: washes hands at sink without UE support                             Pertinent Vitals/Pain Pain Assessment Pain Assessment: 0-10 Pain Score: 5  Pain Location: head Pain Descriptors / Indicators: Pressure Pain Intervention(s): Monitored during session    Home Living Family/patient expects to be discharged to:: Private residence Living Arrangements: Alone Available Help at Discharge: Family;Available PRN/intermittently (daughter) Type of Home: House Home Access: Stairs  to enter Entrance Stairs-Rails: Right;Left Entrance Stairs-Number of Steps: 5   Home Layout: One  level Home Equipment: None Additional Comments: pt reports her rental home is infested with mold and rats, a large stressor on her currently    Prior Function Prior Level of Function : Independent/Modified Independent;Working/employed;Driving             Mobility Comments: working at [employer], recently restarted work after a year off. Had previously stopped working due to frequent falls and a fear of falling       Hand Dominance        Extremity/Trunk Assessment   Upper Extremity Assessment Upper Extremity Assessment: Overall WFL for tasks assessed    Lower Extremity Assessment Lower Extremity Assessment: Generalized weakness    Cervical / Trunk Assessment Cervical / Trunk Assessment: Normal  Communication   Communication: No difficulties  Cognition Arousal/Alertness: Awake/alert Behavior During Therapy: Anxious (expresses a fear of falling) Overall Cognitive Status: Within Functional Limits for tasks assessed                                          General Comments General comments (skin integrity, edema, etc.): VSS on RA, orthostatic vitals negative    Exercises     Assessment/Plan    PT Assessment Patient needs continued PT services  PT Problem List Decreased strength;Decreased activity tolerance;Decreased balance;Decreased mobility;Decreased cognition       PT Treatment Interventions DME instruction;Gait training;Stair training;Functional mobility training;Therapeutic activities;Therapeutic exercise;Balance training;Neuromuscular re-education;Patient/family education    PT Goals (Current goals can be found in the Care Plan section)  Acute Rehab PT Goals Patient Stated Goal: To reduce falls risk and dizziness PT Goal Formulation: With patient Time For Goal Achievement: 08/20/21 Potential to Achieve Goals: Good Additional Goals Additional Goal #1: Pt will score >19/24 on the DGI to indicate a reduced risk for falls    Frequency Min  3X/week     Co-evaluation               AM-PAC PT "6 Clicks" Mobility  Outcome Measure Help needed turning from your back to your side while in a flat bed without using bedrails?: None Help needed moving from lying on your back to sitting on the side of a flat bed without using bedrails?: None Help needed moving to and from a bed to a chair (including a wheelchair)?: A Little Help needed standing up from a chair using your arms (e.g., wheelchair or bedside chair)?: A Little Help needed to walk in hospital room?: A Little Help needed climbing 3-5 steps with a railing? : A Lot 6 Click Score: 19    End of Session   Activity Tolerance: Patient limited by fatigue Patient left: in bed;with call bell/phone within reach;with chair alarm set Nurse Communication: Mobility status PT Visit Diagnosis: Other abnormalities of gait and mobility (R26.89);Muscle weakness (generalized) (M62.81)    Time: 1531-1601 PT Time Calculation (min) (ACUTE ONLY): 30 min   Charges:   PT Evaluation $PT Eval Low Complexity: [place], PT, DPT Acute Rehabilitation Office [phone]   Zenaida Niece 08/06/2021, 4:13 PM

## 2021-08-06 NOTE — Progress Notes (Signed)
PROGRESS NOTE  Brianna Wood PIR:[id number] DOB: 1965-05-10   PCP: Dineen Kid, MD  Patient is from: Home.  Lives alone.  DOA: 08/05/2021 LOS: 0  Chief complaints Chief Complaint  Patient presents with   Loss of Consciousness     Brief Narrative / Interim history: 56 year old F with PMH of anxiety, panic attacks, "somatization disorder" DM-2 with neuropathy, HLD, right-sided CVA, history of DVT and noncompliance with medication presenting with what she calls "blacking out" that lasted for her second but without collapse.  Reportedly, moved into new rental house infested with rats and mold in March 2023, and she has not been able to sleep, overwhelmed and confused.  She reports not taking him medications for months.  Does not recall taking warfarin or history of DVT or blood clot.  Also stopped taking her anxiety medicine.   In ED, vital signs stable. Na 132. Cr 1.13.  Glucose 390.  VBG, CBC with differential without significant finding.  Troponin negative.  UA with > 500 glucose.  UDS negative.  Twelve-lead EKG NSR with nonspecific T wave changes.  CXR without acute finding.  MRI brain and CT angio head and neck ordered.  Psychiatry consulted, and patient was admitted with working diagnosis of acute metabolic encephalopathy.  MRI brain without acute finding but concerning for chronic small vessel ischemic disease.  Patient started complaining left leg pain and weakness with focal tenderness in her left medial thigh.  TSH, CRP, ESR, B12, EEG and orthostatic vitals without significant finding.  Discussed with on-call neurologist, who did not think additional inpatient work-up was needed, and recommended outpatient follow-up.  Psychiatry to see patient.  Subjective: Seen and examined earlier this morning after she returned from MRI.  She reports an episode of "blacking out" that lasted few seconds earlier this morning.  She also complains of left thigh pain and tenderness.  She looks  overwhelmed and anxious.  She tells me her apartment is infested with rats and molds and she has to eat from outside.  She also tells me that she has not taken her medication in months.  She does not recall history of DVT or taking warfarin either. Patient reports this fall at home and hitting her head against the bathroom commode about 2 weeks prior while trying to get out of the shower.  She does not recall passing out at that time.  Objective: Vitals:   08/06/21 0308 08/06/21 0745 08/06/21 1203 08/06/21 1644  BP: (!) 141/76 132/70 (!) 146/78 134/81  Pulse: 66 67 61 67  Resp: 18 18  19   Temp: 98.2 F (36.8 C) 98.7 F (37.1 C) 98.7 F (37.1 C) 98.6 F (37 C)  TempSrc: Oral Oral Oral Oral  SpO2: 97% 98% 99% 96%  Weight:      Height:        Examination:  GENERAL: No apparent distress.  Nontoxic. HEENT: MMM.  Vision and hearing grossly intact.  NECK: Supple.  No apparent JVD.  RESP:  No IWOB.  Fair aeration bilaterally. CVS:  RRR. Heart sounds normal.  ABD/GI/GU: BS+. Abd soft, NTND.  MSK/EXT:  Moves extremities. No apparent deformity. No edema.  Left eye exam basically normal.  2+ DP pulses. SKIN: no apparent skin lesion or wound NEURO: Awake, alert and oriented x4.  3+/5 with left hip flexion.  PSYCH: Appears anxious.  Procedures:  None  Microbiology summarized: None  Assessment and plan: Principal Problem:   LOC (loss of consciousness) ([place]) Active Problems:   Confusion  Anxiety and depression   Uncontrolled type 2 diabetes mellitus with diabetic polyneuropathy, with long-term current use of insulin   Chronic pain syndrome   Essential hypertension   History of noncompliance with medical treatment   Late effects of CVA (cerebrovascular accident)   Right-sided cerebrovascular accident (CVA) ([place])   Loss of consciousness?  Patient reports intermittent and transient blackout that lasted about few seconds.  No associated fall with LOC.  She reports fall and hitting  her head about 2 weeks prior when she was walking out of shower.  Concussion?  Reports confusion although she is oriented x4.  History of CVA with "late effect".  No history of seizure.  She has history of anxiety, panic attack and "somatization syndrome".   Work-up including basic labs, MRI brain, EEG, TSH, B12, CRP and ESR without significant finding other than hyperglycemia.  Orthostatic vitals negative.  No significant events on telemetry.  EKG reassuring.  Troponins negative.  She seems to have LLE weakness with hip flexion.  She also reports pain in her left thigh although exam is reassuring other than the weakness.  No MRI brain finding to explain patient's symptoms.  She has history of chronic pain and DDD.  -Discussed with on-call neurology.  No additional inpatient work-up.  -Recommended psychiatry evaluation and outpatient follow-up with neurology -Continue telemetry monitoring -Waiting on psychiatry input.  Consult requested on admission. -SLP consult for cognitive evaluation -PT/OT eval  Anxiety/depression/history of panic attack: Has not taken her medications in over a months.  Anxious about home infestation with rash or molds.  Medical work-up unrevealing other than hyperglycemia.  History of "somatizations disorder".  -Psychiatry consulted on admission.  Uncontrolled diabetes with hyperglycemia: A1c 14.2%.  Supposed to be on glipizide and metformin but not taking Recent Labs  Lab 08/05/21 1717 08/05/21 2135 08/06/21 0610 08/06/21 1158 08/06/21 1554  GLUCAP 221* 216* 210* 265* 230*  -Increase SSI to moderate -Add NovoLog 4 units 3 times daily with meals -Consider adding basal if remains elevated -Check lipid panel   History of right CVA: She has LLE weakness and pain with hip flexion.  Unclear if this is new or old.  Not on medication for secondary prophylaxis. -Check lipid panel -PT/OT eval  Left thigh pain/LLE weakness: She has LLE weakness and pain with hip flexion.   History of chronic pain.  Otherwise, exam unremarkable. -Check CK -Consider lumbar MRI -Trial of muscle relaxant -PT/OT eval  Essential hypertension: Blood pressure within acceptable range. -Continue monitoring  History of DVT: Patient does not recall this or taking warfarin.  INR 1.0. -Subcu Lovenox for VTE prophylaxis   Body mass index is 28.7 kg/m.          DVT prophylaxis:  enoxaparin (LOVENOX) injection 40 mg Start: 08/06/21 2000  Code Status: Full code Family Communication: None at bedside Level of care: Telemetry Medical Status is: Observation The patient remains OBS appropriate and will d/c before 2 midnights.   Final disposition: TBD Consultants:  Neurology over the phone Psychiatry  Sch Meds:  Scheduled Meds:  enoxaparin (LOVENOX) injection  40 mg Subcutaneous Q24H   [START ON 08/07/2021] insulin aspart  0-15 Units Subcutaneous TID WC   insulin aspart  0-5 Units Subcutaneous QHS   [START ON 08/07/2021] insulin aspart  4 Units Subcutaneous TID WC   Continuous Infusions:  sodium chloride 50 mL/hr at 08/06/21 0652   PRN Meds:.acetaminophen, methocarbamol, mouth rinse  Antimicrobials: Anti-infectives (From admission, onward)    None  I have personally reviewed the following labs and images: CBC: Recent Labs  Lab 08/05/21 1059 08/05/21 1130 22-Aug-2021 0440  WBC 7.6  --  7.9  NEUTROABS 5.3  --   --   HGB 12.1 12.6 13.4  HCT 35.8* 37.0 37.1  MCV 87.1  --  86.1  PLT 219  --  252   BMP &GFR Recent Labs  Lab 08/05/21 1059 08/05/21 1130 Aug 22, 2021 0440  NA 132* 132*  --   K 4.3 4.8  --   CL 102  --   --   CO2 25  --   --   GLUCOSE 390*  --   --   BUN 15  --   --   CREATININE 0.90  --  0.74  CALCIUM 8.8*  --   --   MG 1.9  --   --    Estimated Creatinine Clearance: 98.2 mL/min (by C-G formula based on SCr of 0.74 mg/dL). Liver & Pancreas: Recent Labs  Lab 08/05/21 1059  AST 17  ALT 16  ALKPHOS 113  BILITOT 0.6  PROT 6.5   ALBUMIN 3.4*   No results for input(s): "LIPASE", "AMYLASE" in the last 168 hours. No results for input(s): "AMMONIA" in the last 168 hours. Diabetic: Recent Labs    08/05/21 1630  HGBA1C 14.2*   Recent Labs  Lab 08/05/21 1717 08/05/21 2135 Aug 22, 2021 0610 Aug 22, 2021 1158 August 22, 2021 1554  GLUCAP 221* 216* 210* 265* 230*   Cardiac Enzymes: No results for input(s): "CKTOTAL", "CKMB", "CKMBINDEX", "TROPONINI" in the last 168 hours. No results for input(s): "PROBNP" in the last 8760 hours. Coagulation Profile: Recent Labs  Lab 2021-08-22 0707  INR 1.0   Thyroid Function Tests: Recent Labs    August 22, 2021 0440  TSH 1.016   Lipid Profile: No results for input(s): "CHOL", "HDL", "LDLCALC", "TRIG", "CHOLHDL", "LDLDIRECT" in the last 72 hours. Anemia Panel: Recent Labs    08/22/2021 0440  VITAMINB12 284   Urine analysis:    Component Value Date/Time   COLORURINE YELLOW 08/05/2021 1204   APPEARANCEUR CLEAR 08/05/2021 1204   LABSPEC 1.010 08/05/2021 1204   PHURINE 5.5 08/05/2021 1204   GLUCOSEU >=500 (A) 08/05/2021 1204   HGBUR NEGATIVE 08/05/2021 Experiment 08/05/2021 1204   KETONESUR NEGATIVE 08/05/2021 1204   PROTEINUR NEGATIVE 08/05/2021 1204   UROBILINOGEN 1.0 08/29/2010 1422   NITRITE NEGATIVE 08/05/2021 1204   LEUKOCYTESUR NEGATIVE 08/05/2021 1204   Sepsis Labs: Invalid input(s): "PROCALCITONIN", "LACTICIDVEN"  Microbiology: No results found for this or any previous visit (from the past 240 hour(s)).  Radiology Studies: EEG adult  Result Date: 08-22-21 Lora Havens, MD     08/22/21  3:17 PM Patient Name: WARREN KUGELMAN MRN: [id number] Epilepsy Attending: Lora Havens Referring Physician/Provider: Mercy Riding, MD Date: 08/22/21 Duration: 22.08 mins Patient history: 56yo F with ams. EEG to evaluate for seizure Level of alertness: Awake, asleep AEDs during EEG study: None Technical aspects: This EEG study was done with scalp electrodes  positioned according to the 10-20 International system of electrode placement. Electrical activity was acquired at a sampling rate of 500Hz  and reviewed with a high frequency filter of 70Hz  and a low frequency filter of 1Hz . EEG data were recorded continuously and digitally stored. Description: The posterior dominant rhythm consists of 9 Hz activity of moderate voltage (25-35 uV) seen predominantly in posterior head regions, symmetric and reactive to eye opening and eye closing. Sleep was characterized by vertex waves, sleep spindles (12  to 14 Hz), maximal frontocentral region. Hyperventilation and photic stimulation were not performed.   IMPRESSION: This study is within normal limits. No seizures or epileptiform discharges were seen throughout the recording. Lora Havens   MR BRAIN WO CONTRAST  Result Date: 08/06/2021 CLINICAL DATA:  Provided history: Delirium, confusion. Additional history provided: History of anxiety/depression, somatization disorder, type 2 diabetes, diabetic polyneuropathy, hyperlipidemia, history of right-sided CVA, history of DVT on Coumadin. EXAM: MRI HEAD WITHOUT CONTRAST TECHNIQUE: Multiplanar, multiecho pulse sequences of the brain and surrounding structures were obtained without intravenous contrast. COMPARISON:  Noncontrast head CT and CT angiogram head/neck 07/06/2021. FINDINGS: Brain: No age advanced or lobar predominant parenchymal atrophy. Multifocal T2 FLAIR hyperintense signal abnormality within the cerebral white matter, overall mild but advanced for age. Findings are nonspecific, but likely reflect chronic small vessel ischemic disease given the patient's history of diabetes and hyperlipidemia. Small chronic infarcts within the left cerebellar hemisphere. There is no acute infarct. No evidence of an intracranial mass. No chronic intracranial blood products. No extra-axial fluid collection. No midline shift. Vascular: Maintained flow voids within the proximal large  arterial vessels. Skull and upper cervical spine: No focal suspicious marrow lesion. Sinuses/Orbits: No mass or acute finding within the imaged orbits. Minimal mucosal thickening within the bilateral ethmoid and maxillary sinuses. IMPRESSION: 1. No evidence of acute intracranial abnormality. 2. Multifocal T2 FLAIR hyperintense signal abnormality within the cerebral white matter, overall mild but advanced for age. Findings are nonspecific, but likely reflect chronic small vessel ischemic disease given the patient's history of diabetes and hyperlipidemia. 3. Small chronic infarcts within the left cerebellar hemisphere. 4. Minimal mucosal thickening within the bilateral ethmoid and maxillary sinuses. Electronically Signed   By: Kellie Simmering D.O.   On: 08/06/2021 11:30   DG Abd Portable 1V  Result Date: 08/06/2021 CLINICAL DATA:  Middle implant EXAM: PORTABLE ABDOMEN - 1 VIEW COMPARISON:  None Available. FINDINGS: The bowel gas pattern is normal. No radio-opaque calculi or other significant radiographic abnormality are seen. No metallic foreign body seen in the abdomen or pelvis. IMPRESSION: No metallic foreign body seen in the abdomen or pelvis. Electronically Signed   By: Yetta Glassman M.D.   On: 08/06/2021 09:51      [place]  If 7PM-7AM, please contact night-coverage [email or web address] 08/06/2021, 7:05 PM

## 2021-08-06 NOTE — Plan of Care (Signed)

## 2021-08-06 NOTE — Consult Note (Signed)
  Brianna Wood Is a 56 year old female who presented to [place] with confusion.  As per chart review patient reports little sleep since March along with multiple syncopal episodes.  Patient has previous medical history of anxiety, depression, somatic sedation disorder, type 2 diabetes, diabetic polyneuropathy, hyperlipidemia, CVA, DVT.  Psych consult placed for uncontrolled anxiety/depression; concern for somatic cessation disorder.   Attempted to assess patient was unsuccessful, currently having neurology workup (EEG and very anxious) .  Considering neurology workup is within normal limits, will allow additional time for primary team to discuss psychiatric and psychological concerns with patient, prior to second attempt to assess.  Upon further chart review patient does have a history of somatizations disorder, and consult liaison service will discuss and evaluate for worsening anxiety and depression that could be contributing to increase in somatic complaints.  -Psychiatry service will attempt to reassess tomorrow. -If patient continues to present with increased anxiety as noted during earlier visit, we will recommend as needed hydroxyzine until assessed by psychiatry tomorrow.

## 2021-08-06 NOTE — Procedures (Signed)
Patient Name: Brianna Wood  MRN: [id number]  Epilepsy Attending: Lora Havens  Referring Physician/Provider: Mercy Riding, MD  Date: 08/06/2021 Duration: 22.08 mins  Patient history: 56yo F with ams. EEG to evaluate for seizure  Level of alertness: Awake, asleep  AEDs during EEG study: None  Technical aspects: This EEG study was done with scalp electrodes positioned according to the 10-20 International system of electrode placement. Electrical activity was acquired at a sampling rate of '500Hz'$  and reviewed with a high frequency filter of '70Hz'$  and a low frequency filter of '1Hz'$ . EEG data were recorded continuously and digitally stored.   Description: The posterior dominant rhythm consists of 9 Hz activity of moderate voltage (25-35 uV) seen predominantly in posterior head regions, symmetric and reactive to eye opening and eye closing. Sleep was characterized by vertex waves, sleep spindles (12 to 14 Hz), maximal frontocentral region. Hyperventilation and photic stimulation were not performed.     IMPRESSION: This study is within normal limits. No seizures or epileptiform discharges were seen throughout the recording.  Brianna Wood

## 2021-08-06 NOTE — Progress Notes (Signed)
ANTICOAGULATION CONSULT NOTE - Initial Consult  Pharmacy Consult for Coumadin Indication:  h/o VTE  Allergies  Allergen Reactions   Nickel Rash   Aspirin    Latex Itching and Swelling   Wheat Extract     Other reaction(s): Other (See Comments) Swelling     Patient Measurements: Height: '5\' 10"'$  (177.8 cm) Weight: 90.7 kg (200 lb) IBW/kg (Calculated) : 68.5  Vital Signs: Temp: 98.2 F (36.8 C) (07/12 0308) Temp Source: Oral (07/12 0308) BP: 141/76 (07/12 0308) Pulse Rate: 66 (07/12 0308)  Labs: Recent Labs    08/05/21 1059 08/05/21 1130 08/05/21 1630 08/05/21 1827 08/06/21 0440  HGB 12.1 12.6  --   --  13.4  HCT 35.8* 37.0  --   --  37.1  PLT 219  --   --   --  252  CREATININE 0.90  --   --   --   --   TROPONINIHS  --   --  4 3  --     Estimated Creatinine Clearance: 87.3 mL/min (by C-G formula based on SCr of 0.9 mg/dL).   Medical History: Past Medical History:  Diagnosis Date   Diabetes mellitus    Panic attacks     Assessment: 56yo female c/o confusion, admitted for acute metabolic encephalopathy, to resume Coumadin for h/o VTE; of note pt had been known to be noncompliant with medications per outpt notes in [facility], no recent anticoagulation OP notes found; last [place] mention of Coumadin was in 2018 when pt reported she stopped taking Coumadin and started taking Plavix after a CVA.  Goal of Therapy:  INR 2-3   Plan:  Monitor INR for dose adjustments; f/u w/ pt regarding prior Coumadin dosing.  Wynona Neat, PharmD, BCPS  08/06/2021,5:42 AM

## 2021-08-06 NOTE — H&P (Signed)
History and Physical  Brianna Wood ZOX:[id number] DOB: 08-Feb-1965 DOA: 08/05/2021  Referring physician: Accepted by Dr. Melissa Montane, hospitalist service PCP: Dineen Kid, MD  Outpatient Specialists: Orthopedic surgery. Patient coming from: Home via [facility] ED  Chief Complaint: Confusion  HPI: Brianna Wood is a 56 y.o. female with medical history significant for chronic anxiety/depression, somatization disorder, type 2 diabetes, diabetic polyneuropathy, hyperlipidemia, history of right-sided CVA, history of DVT on Coumadin, who initially presented to [facility] ED from home due to confusion.  The patient endorses that she has not been getting much sleep after moving to a rental house, in March this year, that is infested with large rats and mold.  Since moving there she has been experiencing near syncope episodes and feels like she is dying.  Her symptoms worsened yesterday around 3:15 AM when she felt hot, overwhelmed, and confused.  She went to work at [employer] around 5:30 AM, she felt that she blacked out at work for a few seconds.  She did not fall.  When she came back to herself, she continued to work.  Around 9 AM she started feeling foggy again and felt that she blacked out again although it happened without a fall.    Due to these symptoms without clear explanation, the patient presented to the ED for further evaluation.  She was teary during this encounter.  Endorses dizziness upon standing.  Denies any palpitations.  CT angio head and neck was nonacute.  The patient was admitted by [facility], hospitalist service.   ED Course: Tmax 98.2.  BP 141/76, pulse 66, respiratory 18, O2 saturation 97% on room air.  Lab studies remarkable for serum sodium 132, glucose 390, albumin 3.4.  CBC with differential unremarkable.  Review of Systems: Review of systems as noted in the HPI. All other systems reviewed and are negative.   Past Medical History:  Diagnosis Date   Diabetes mellitus    Panic attacks     Past Surgical History:  Procedure Laterality Date   TUBAL LIGATION      Social History:  reports that she has never smoked. She has never used smokeless tobacco. She reports current alcohol use. She reports that she does not use drugs.   Allergies  Allergen Reactions   Nickel Rash   Aspirin    Latex Itching and Swelling   Wheat Extract     Other reaction(s): Other (See Comments) Swelling    Family history: Mother: with lupus Dad: Cancer lung cancer. Son: With endocarditis stroke, MRI at the age of 56  Prior to Admission medications   Medication Sig Start Date End Date Taking? Authorizing Provider  BusPIRone HCl (BUSPAR PO) Take by mouth as needed.      [provider]  furosemide (LASIX) 20 MG tablet Take 1 tablet (20 mg total) by mouth daily. 03/02/16   Drenda Freeze, MD  glimepiride (AMARYL) 4 MG tablet Take 1 tablet (4 mg total) by mouth daily with breakfast. 03/02/16   Drenda Freeze, MD  GLIPIZIDE PO Take by mouth.    [provider]  metFORMIN (GLUMETZA) 500 MG (MOD) 24 hr tablet Take 500 mg by mouth daily with breakfast.      [provider]  methocarbamol (ROBAXIN) 500 MG tablet Take 500 mg by mouth 4 (four) times daily.    [provider]  traMADol (ULTRAM) 50 MG tablet Take 1 tablet (50 mg total) by mouth every 6 (six) hours as needed. 03/02/16   Shirlyn Goltz  Hsienta, MD  Warfarin Sodium (COUMADIN PO) Take by mouth.    [provider]    Physical Exam: BP (!) 154/83 (BP Location: Right Arm)   Pulse 70   Temp 97.7 F (36.5 C) (Oral)   Resp 20   Ht '5\' 10"'$  (1.778 m)   Wt 90.7 kg   LMP 04/29/2010   SpO2 98%   BMI 28.70 kg/m   General: 56 y.o. year-old female well developed well nourished in no acute distress.  Alert and oriented x3. Cardiovascular: Regular rate and rhythm with no rubs or gallops.  No thyromegaly or JVD noted.  No lower extremity edema. 2/4 pulses in all 4 extremities. Respiratory: Clear to  auscultation with no wheezes or rales. Good inspiratory effort. Abdomen: Soft nontender nondistended with normal bowel sounds x4 quadrants. Muskuloskeletal: No cyanosis, clubbing or edema noted bilaterally Neuro: CN II-XII intact, strength, sensation, reflexes Skin: No ulcerative lesions noted or rashes Psychiatry: Judgement and insight appear normal. Mood is appropriate for condition and setting          Labs on Admission:  Basic Metabolic Panel: Recent Labs  Lab 08/05/21 1059 08/05/21 1130  NA 132* 132*  K 4.3 4.8  CL 102  --   CO2 25  --   GLUCOSE 390*  --   BUN 15  --   CREATININE 0.90  --   CALCIUM 8.8*  --   MG 1.9  --    Liver Function Tests: Recent Labs  Lab 08/05/21 1059  AST 17  ALT 16  ALKPHOS 113  BILITOT 0.6  PROT 6.5  ALBUMIN 3.4*   No results for input(s): "LIPASE", "AMYLASE" in the last 168 hours. No results for input(s): "AMMONIA" in the last 168 hours. CBC: Recent Labs  Lab 08/05/21 1059 08/05/21 1130  WBC 7.6  --   NEUTROABS 5.3  --   HGB 12.1 12.6  HCT 35.8* 37.0  MCV 87.1  --   PLT 219  --    Cardiac Enzymes: No results for input(s): "CKTOTAL", "CKMB", "CKMBINDEX", "TROPONINI" in the last 168 hours.  BNP (last 3 results) No results for input(s): "BNP" in the last 8760 hours.  ProBNP (last 3 results) No results for input(s): "PROBNP" in the last 8760 hours.  CBG: Recent Labs  Lab 08/05/21 1028 08/05/21 1554 08/05/21 1717 08/05/21 2135  GLUCAP 403* 266* 221* 216*    Radiological Exams on Admission: DG Chest Portable 1 View  Result Date: 08/05/2021 CLINICAL DATA:  Chest pain EXAM: PORTABLE CHEST 1 VIEW COMPARISON:  05/29/2015 FINDINGS: The heart size and mediastinal contours are within normal limits. Both lungs are clear. The visualized skeletal structures are unremarkable. IMPRESSION: No active disease. Electronically Signed   By: Elmer Picker M.D.   On: 08/05/2021 16:34   CT ANGIO HEAD NECK W WO CM  Result Date:  08/05/2021 CLINICAL DATA:  Numbness or tingling, paresthesia EXAM: CT ANGIOGRAPHY HEAD AND NECK TECHNIQUE: Multidetector CT imaging of the head and neck was performed using the standard protocol during bolus administration of intravenous contrast. Multiplanar CT image reconstructions and MIPs were obtained to evaluate the vascular anatomy. Carotid stenosis measurements (when applicable) are obtained utilizing NASCET criteria, using the distal internal carotid diameter as the denominator. RADIATION DOSE REDUCTION: This exam was performed according to the departmental dose-optimization program which includes automated exposure control, adjustment of the mA and/or kV according to patient size and/or use of iterative reconstruction technique. CONTRAST:  147m OMNIPAQUE IOHEXOL 350 MG/ML SOLN COMPARISON:  2017 FINDINGS: CT HEAD Brain: There is no acute intracranial hemorrhage, mass effect, or edema. Gray-white differentiation is preserved. There is no extra-axial fluid collection. Ventricles and sulci are within normal limits in size and configuration. Age-indeterminate but probably chronic small left cerebellar infarct Vascular: Better evaluated on CTA portion. Skull: Calvarium is unremarkable. Sinuses/Orbits: No acute finding. Other: None. Review of the MIP images confirms the above findings CTA NECK Aortic arch: Minimal calcified plaque. Great vessel origins are patent. Right carotid system: Patent. Mild eccentric noncalcified plaque at the ICA origin without stenosis. Left carotid system: Patent. Trace plaque at the ICA origin without stenosis. Vertebral arteries: Patent. Left vertebral is dominant. No stenosis. Skeleton: No acute or significant osseous abnormality. Other neck: Unremarkable. Upper chest: No apical lung mass. Review of the MIP images confirms the above findings CTA HEAD Anterior circulation: Intracranial internal carotid arteries patent with mild plaque. Anterior and middle cerebral arteries are  patent. Posterior circulation: Intracranial vertebral arteries are patent. Basilar artery is patent. Major cerebellar artery origins are patent. Bilateral posterior communicating arteries are present. Posterior cerebral arteries are patent. Venous sinuses: Patent as allowed by contrast bolus timing. Review of the MIP images confirms the above findings IMPRESSION: No acute intracranial abnormality. 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 No large vessel occlusion, hemodynamically significant stenosis, or evidence of dissection. Electronically Signed   By: Macy Mis M.D.   On: 08/05/2021 12:48    EKG: I independently viewed the EKG done and my findings are as followed: Normal sinus rhythm, rate of 80, nonspecific ST-T changes.  QTc 435.  Assessment/Plan Present on Admission:  Confusion  Principal Problem:   Confusion  Acute metabolic encephalopathy, unclear etiology. She is back to her baseline mentation. Continue to treat underlying conditions CTA head and neck nonacute. MRI brain pending Fall precautions.  Concern for possible somatization disorder History of somatization disorder Psychiatry consulted to further assess  Pseudohyponatremia Serum sodium presentation 132, serum glucose 390 Corrected sodium for hyperglycemia 137  Type 2 diabetes with hyperglycemia Presenting serum glucose 390 Hemoglobin A1c, 14.2 on 08/05/2021 Start insulin sliding scale Hold off home oral hypoglycemics  Chronic anxiety/depression Teary during encounter Resume home regimen Psychiatry consulted to assist with the management.  History of DVT on Coumadin Resume Coumadin Pharmacy consulted to renally dose Obtain INR    DVT prophylaxis: Home Coumadin  Code Status: Full code  Family Communication: None at bedside.  Disposition Plan: Admitted to telemetry medical unit  Consults called: Psych  Admission status: Observation status   Status is:  Observation    Kayleen Memos MD [facility] Pager [phone]  If 7PM-7AM, please contact night-coverage [email or web address] Password [facility]  08/06/2021, 2:59 AM

## 2021-08-06 NOTE — Progress Notes (Signed)
EEG complete - results pending 

## 2021-08-06 NOTE — TOC Initial Note (Signed)
Transition of Care [facility]) - Initial/Assessment Note    Patient Details  Name: Brianna Wood MRN: [id number] Date of Birth: [birth year]  Transition of Care [facility]) CM/SW Contact:    Pollie Friar, RN Phone Number: 08/06/2021, 2:57 PM  Clinical Narrative:                 Pt admitted with confusion. She is from home alone. Awaiting therapy evaluations. TOC following.  Expected Discharge Plan: Home/Self Care Barriers to Discharge: Continued Medical Work up   Patient Goals and CMS Choice        Expected Discharge Plan and Services Expected Discharge Plan: Home/Self Care                                              Prior Living Arrangements/Services                       Activities of Daily Living Home Assistive Devices/Equipment: None ADL Screening (condition at time of admission) Patient's cognitive ability adequate to safely complete daily activities?: Yes Is the patient deaf or have difficulty hearing?: No Does the patient have difficulty seeing, even when wearing glasses/contacts?: No Does the patient have difficulty concentrating, remembering, or making decisions?: No Patient able to express need for assistance with ADLs?: Yes Does the patient have difficulty dressing or bathing?: No Independently performs ADLs?: Yes (appropriate for developmental age) Does the patient have difficulty walking or climbing stairs?: Yes Weakness of Legs: Both Weakness of Arms/Hands: Both  Permission Sought/Granted                  Emotional Assessment              Admission diagnosis:  Confusion [R41.0] Paresthesias [R20.2] Near syncope [R55] Patient Active Problem List   Diagnosis Date Noted   Confusion 08/05/2021   PCP:  Dineen Kid, MD Pharmacy:   [facility] [phone] - [place], [place] - [address] [place] [address] Phone: [phone] Fax: [phone]     Social  Determinants of Health (SDOH) Interventions    Readmission Risk Interventions     No data to display

## 2021-08-07 ENCOUNTER — Other Ambulatory Visit (HOSPITAL_COMMUNITY): Payer: Self-pay

## 2021-08-07 DIAGNOSIS — G894 Chronic pain syndrome: Secondary | ICD-10-CM | POA: Diagnosis not present

## 2021-08-07 DIAGNOSIS — F419 Anxiety disorder, unspecified: Secondary | ICD-10-CM | POA: Diagnosis not present

## 2021-08-07 DIAGNOSIS — R410 Disorientation, unspecified: Secondary | ICD-10-CM | POA: Diagnosis not present

## 2021-08-07 DIAGNOSIS — R4020 Unspecified coma: Secondary | ICD-10-CM | POA: Diagnosis not present

## 2021-08-07 LAB — RENAL FUNCTION PANEL
Albumin: 2.9 g/dL — ABNORMAL LOW (ref 3.5–5.0)
Anion gap: 8 (ref 5–15)
BUN: 14 mg/dL (ref 6–20)
CO2: 24 mmol/L (ref 22–32)
Calcium: 8.8 mg/dL — ABNORMAL LOW (ref 8.9–10.3)
Chloride: 102 mmol/L (ref 98–111)
Creatinine, Ser: 0.95 mg/dL (ref 0.44–1.00)
GFR, Estimated: >60 mL/min (ref >60)
Glucose, Bld: 385 mg/dL — ABNORMAL HIGH (ref 70–99)
Phosphorus: 4 mg/dL (ref 2.5–4.6)
Potassium: 3.9 mmol/L (ref 3.5–5.1)
Sodium: 134 mmol/L — ABNORMAL LOW (ref 135–145)

## 2021-08-07 LAB — LIPID PANEL
Cholesterol: 280 mg/dL — ABNORMAL HIGH (ref 0–200)
HDL: 32 mg/dL — ABNORMAL LOW (ref >40)
LDL Cholesterol: 204 mg/dL — ABNORMAL HIGH (ref 0–99)
Total CHOL/HDL Ratio: 8.8 RATIO
Triglycerides: 219 mg/dL — ABNORMAL HIGH (ref <150)
VLDL: 44 mg/dL — ABNORMAL HIGH (ref 0–40)

## 2021-08-07 LAB — CBC
HCT: 35.9 % — ABNORMAL LOW (ref 36.0–46.0)
Hemoglobin: 12.4 g/dL (ref 12.0–15.0)
MCH: 29.6 pg (ref 26.0–34.0)
MCHC: 34.5 g/dL (ref 30.0–36.0)
MCV: 85.7 fL (ref 80.0–100.0)
Platelets: 224 10*3/uL (ref 150–400)
RBC: 4.19 MIL/uL (ref 3.87–5.11)
RDW: 12.1 % (ref 11.5–15.5)
WBC: 8.1 10*3/uL (ref 4.0–10.5)
nRBC: 0 % (ref 0.0–0.2)

## 2021-08-07 LAB — MAGNESIUM: Magnesium: 1.8 mg/dL (ref 1.7–2.4)

## 2021-08-07 LAB — GLUCOSE, CAPILLARY
Glucose-Capillary: 283 mg/dL — ABNORMAL HIGH (ref 70–99)
Glucose-Capillary: 246 mg/dL — ABNORMAL HIGH (ref 70–99)
Glucose-Capillary: 202 mg/dL — ABNORMAL HIGH (ref 70–99)

## 2021-08-07 LAB — CK: Total CK: 47 U/L (ref 38–234)

## 2021-08-07 MED ORDER — INSULIN GLARGINE-YFGN 100 UNIT/ML ~~LOC~~ SOLN
15.0000 [IU] | Freq: Two times a day (BID) | SUBCUTANEOUS | Status: DC
  Administered 2021-08-07: 15 [IU] via SUBCUTANEOUS
  Filled 2021-08-07 (×2): qty 0.15

## 2021-08-07 MED ORDER — ASPIRIN 81 MG PO TBEC
81.0000 mg | DELAYED_RELEASE_TABLET | Freq: Every day | ORAL | 0 refills | Status: DC
  Filled 2021-08-07: qty 90, 90d supply, fill #0

## 2021-08-07 MED ORDER — BUSPIRONE HCL 5 MG PO TABS
5.0000 mg | ORAL_TABLET | Freq: Three times a day (TID) | ORAL | 0 refills | Status: AC
Start: 2021-08-07 — End: 2021-09-06
  Filled 2021-08-07: qty 90, 30d supply, fill #0

## 2021-08-07 MED ORDER — MULTI-VITAMIN/MINERALS PO TABS: 1.0000 | ORAL_TABLET | Freq: Every day | ORAL | 2 refills | Status: AC

## 2021-08-07 MED ORDER — SERTRALINE HCL 50 MG PO TABS
25.0000 mg | ORAL_TABLET | Freq: Every day | ORAL | Status: DC
  Administered 2021-08-07: 25 mg via ORAL
  Filled 2021-08-07 (×2): qty 1

## 2021-08-07 MED ORDER — INSULIN ASPART 100 UNIT/ML IJ SOLN
6.0000 [IU] | Freq: Three times a day (TID) | INTRAMUSCULAR | Status: DC
  Administered 2021-08-07: 6 [IU] via SUBCUTANEOUS

## 2021-08-07 MED ORDER — SERTRALINE HCL 25 MG PO TABS
25.0000 mg | ORAL_TABLET | Freq: Every day | ORAL | 1 refills | Status: AC
Start: 2021-08-08 — End: ?
  Filled 2021-08-07: qty 90, 90d supply, fill #0

## 2021-08-07 MED ORDER — GLIPIZIDE 10 MG PO TABS
10.0000 mg | ORAL_TABLET | Freq: Every day | ORAL | 0 refills | Status: AC
  Filled 2021-08-07: qty 90, 90d supply, fill #0

## 2021-08-07 MED ORDER — ACETAMINOPHEN 325 MG PO TABS: 650.0000 mg | ORAL_TABLET | ORAL | Status: AC | PRN

## 2021-08-07 MED ORDER — METFORMIN HCL 1000 MG PO TABS
1000.0000 mg | ORAL_TABLET | Freq: Two times a day (BID) | ORAL | 0 refills | Status: AC
  Filled 2021-08-07: qty 180, 90d supply, fill #0

## 2021-08-07 MED ORDER — BUSPIRONE HCL 10 MG PO TABS
5.0000 mg | ORAL_TABLET | Freq: Three times a day (TID) | ORAL | Status: DC
  Administered 2021-08-07: 5 mg via ORAL
  Filled 2021-08-07: qty 1

## 2021-08-07 MED ORDER — ATORVASTATIN CALCIUM 40 MG PO TABS
40.0000 mg | ORAL_TABLET | Freq: Every day | ORAL | Status: DC
  Administered 2021-08-07: 40 mg via ORAL
  Filled 2021-08-07: qty 1

## 2021-08-07 MED ORDER — ATORVASTATIN CALCIUM 40 MG PO TABS
40.0000 mg | ORAL_TABLET | Freq: Every day | ORAL | 1 refills | Status: AC
  Filled 2021-08-07: qty 90, 90d supply, fill #0

## 2021-08-07 NOTE — Consult Note (Signed)
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 Cyndia Skeeters Patient Identification: BROOKS STOTZ MRN:  [id number] Principal Diagnosis: LOC (loss of consciousness) ([place]) Diagnosis:  Principal Problem:   LOC (loss of consciousness) ([place]) Active Problems:   Confusion   Anxiety and depression   Uncontrolled type 2 diabetes mellitus with diabetic polyneuropathy, with long-term current use of insulin   Chronic pain syndrome   Essential hypertension   History of noncompliance with medical treatment   Late effects of CVA (cerebrovascular accident)   Right-sided cerebrovascular accident (CVA) ([place])   Total Time spent with patient: 45 minutes  Subjective:   Brianna Wood is a 56 y.o. female patient admitted with confusion.   Patient endorses increased anxiety symptoms over the past few months. She endorses recent triggers and stressors of rat infested apartment and son is sick. She identifies some symptoms of depression, however her anxiety is her main concern at this time. SHe states she was on zoloft (?dose) up until last year, as she had become more stable and her physician felt it was safe to dc. However upon moving into her new place in March, her anxiety has become worse. In addition to her anxiety she has multiple medical problems that she feels maybe from her anxiety. She denies any further psychiatric concerns at this time. She denies any acute symptoms of mania at this time to include impulsivity, grandiosity, mood lability, hypersexuality.  She does not present with any of the above symptoms on this evaluation.  She denies any acute psychosis, paranoia.  She does not appear to be displaying any or responding to internal stimuli, external stimuli, or exhibiting delusional thought disorder. She denies any history of suicidal ideations, suicidal intent. She endorses one isolated episode of  suicide attempt in the 9th grade, by overdose which was self aborted. " I threw the medicine up and didn't go to the hospital."   Patient denies any access to weapons, denies any alcohol and or substance abuse.  She reports moderate sleep and fair appetite.   Patient denies any auditory and/or visual hallucinations, does not appear to be responding to internal or external stimuli.  There is no evidence of delusional thought content and patient appears to answer all questions appropriately.  At this time patient appears to be psychiatrically stable to discharge home, with support system services in place.     Today upon evaluation patient is appropriate and alert. She appears to be engaging well with staff and Probation officer. She acknowledges her weaknesses,  and likelihood of medical problems manifesting from her anxiety and stressed. She is agreeable to medication at this time, and does not want to start anything new she prefers to resume her Zoloft. She denies suicidal ideations at this time.   HPI:  Brianna Wood Is a 56 year old female who presented to [facility] with confusion.  As per chart review patient reports little sleep since March along with multiple syncopal episodes.  Patient has previous medical history of anxiety, depression, somatic sedation disorder, type 2 diabetes, diabetic polyneuropathy, hyperlipidemia, CVA, DVT.  Psych consult placed for uncontrolled anxiety/depression; concern for somatic cessation disorder.   Past Psychiatric History:   Risk to Self:  Denies Risk to Others:   Denies Prior Inpatient Therapy:   Denies Prior Outpatient Therapy:  Denies  Past Medical History:  Past Medical History:  Diagnosis Date   Diabetes mellitus    Panic attacks  Past Surgical History:  Procedure Laterality Date   TUBAL LIGATION     Family History: History reviewed. No pertinent family history. Family Psychiatric  History: Denies Social History:  Social History    Substance and Sexual Activity  Alcohol Use Yes     Social History   Substance and Sexual Activity  Drug Use No    Social History   Socioeconomic History   Marital status: Single    Spouse name: Not on file   Number of children: Not on file   Years of education: Not on file   Highest education level: Not on file  Occupational History   Not on file  Tobacco Use   Smoking status: Never   Smokeless tobacco: Never  Substance and Sexual Activity   Alcohol use: Yes   Drug use: No   Sexual activity: Yes  Other Topics Concern   Not on file  Social History Narrative   Not on file   Social Determinants of Health   Financial Resource Strain: Not on file  Food Insecurity: Not on file  Transportation Needs: Not on file  Physical Activity: Not on file  Stress: Not on file  Social Connections: Not on file   Additional Social History:    Allergies:   Allergies  Allergen Reactions   Nickel Rash   Aspirin    Latex Itching and Swelling   Wheat Extract Swelling    Labs:  Results for orders placed or performed during the hospital encounter of 08/05/21 (from the past 48 hour(s))  CBG monitoring, ED     Status: Abnormal   Collection Time: 08/05/21  3:54 PM  Result Value Ref Range   Glucose-Capillary 266 (H) 70 - 99 mg/dL    Comment: Glucose reference range applies only to samples taken after fasting for at least 8 hours.  Troponin I (High Sensitivity)     Status: None   Collection Time: 08/05/21  4:30 PM  Result Value Ref Range   Troponin I (High Sensitivity) 4 <18 ng/L    Comment: (NOTE) Elevated high sensitivity troponin I (hsTnI) values and significant  changes across serial measurements may suggest ACS but many other  chronic and acute conditions are known to elevate hsTnI results.  Refer to the "Links" section for chest pain algorithms and additional  guidance. Performed at [facility], [address] [place], [place] [address]   Hemoglobin A1c      Status: Abnormal   Collection Time: 08/05/21  4:30 PM  Result Value Ref Range   Hgb A1c MFr Bld 14.2 (H) 4.8 - 5.6 %    Comment: (NOTE) Pre diabetes:          5.7%-6.4%  Diabetes:              >6.4%  Glycemic control for   <7.0% adults with diabetes    Mean Plasma Glucose 360.84 mg/dL    Comment: Performed at [facility] [address], [place], [place] [address]  POC CBG, ED     Status: Abnormal   Collection Time: 08/05/21  5:17 PM  Result Value Ref Range   Glucose-Capillary 221 (H) 70 - 99 mg/dL    Comment: Glucose reference range applies only to samples taken after fasting for at least 8 hours.  Troponin I (High Sensitivity)     Status: None   Collection Time: 08/05/21  6:27 PM  Result Value Ref Range   Troponin I (High Sensitivity) 3 <18 ng/L  Comment: (NOTE) Elevated high sensitivity troponin I (hsTnI) values and significant  changes across serial measurements may suggest ACS but many other  chronic and acute conditions are known to elevate hsTnI results.  Refer to the "Links" section for chest pain algorithms and additional  guidance. Performed at [facility], [address] [place], [place] [address]   CBG monitoring, ED     Status: Abnormal   Collection Time: 08/05/21  9:35 PM  Result Value Ref Range   Glucose-Capillary 216 (H) 70 - 99 mg/dL    Comment: Glucose reference range applies only to samples taken after fasting for at least 8 hours.   Comment 1 Notify RN   HIV Antibody (routine testing w rflx)     Status: None   Collection Time: 08/06/21  4:40 AM  Result Value Ref Range   HIV Screen 4th Generation wRfx Non Reactive Non Reactive    Comment: Performed at [facility], [place] [address], [place], [place] [address]  CBC     Status: Abnormal   Collection Time: 08/06/21  4:40 AM  Result Value Ref Range   WBC 7.9 4.0 - 10.5 K/uL   RBC 4.31 3.87 - 5.11 MIL/uL   Hemoglobin 13.4 12.0 - 15.0 g/dL   HCT 37.1 36.0 - 46.0 %   MCV 86.1  80.0 - 100.0 fL   MCH 31.1 26.0 - 34.0 pg   MCHC 36.1 (H) 30.0 - 36.0 g/dL   RDW 12.3 11.5 - 15.5 %   Platelets 252 150 - 400 K/uL   nRBC 0.0 0.0 - 0.2 %    Comment: Performed at [facility], [place] [address], [place], [place] [address]  Creatinine, serum     Status: None   Collection Time: 08/06/21  4:40 AM  Result Value Ref Range   Creatinine, Ser 0.74 0.44 - 1.00 mg/dL   GFR, Estimated >60 >60 mL/min    Comment: (NOTE) Calculated using the CKD-EPI Creatinine Equation (2021) Performed at [facility] [address], [place], [place] [address]   TSH     Status: None   Collection Time: 08/06/21  4:40 AM  Result Value Ref Range   TSH 1.016 0.350 - 4.500 uIU/mL    Comment: Performed by a 3rd Generation assay with a functional sensitivity of <=0.01 uIU/mL. Performed at [facility], [place] [address], [place], [place] [address]   C-reactive protein     Status: None   Collection Time: 08/06/21  4:40 AM  Result Value Ref Range   CRP 0.6 <1.0 mg/dL    Comment: Performed at [facility] [address] [place], [place] [address]  Sedimentation rate     Status: None   Collection Time: 08/06/21  4:40 AM  Result Value Ref Range   Sed Rate 12 0 - 22 mm/hr    Comment: Performed at [facility] [address] [place], [place] [address]  Vitamin B12     Status: None   Collection Time: 08/06/21  4:40 AM  Result Value Ref Range   Vitamin B-12 284 180 - 914 pg/mL    Comment: (NOTE) This assay is not validated for testing neonatal or myeloproliferative syndrome specimens for Vitamin B12 levels. Performed at [facility], [place] [address] [place], [place] [address]   Glucose, capillary     Status: Abnormal   Collection Time: 08/06/21  6:10 AM  Result Value Ref Range  Glucose-Capillary 210 (H) 70 - 99 mg/dL    Comment: Glucose reference range applies only to samples taken after fasting for at least 8 hours.   Comment 1 Notify RN    Comment 2 Document  in Chart   Protime-INR     Status: None   Collection Time: 08/06/21  7:07 AM  Result Value Ref Range   Prothrombin Time 12.7 11.4 - 15.2 seconds   INR 1.0 0.8 - 1.2    Comment: (NOTE) INR goal varies based on device and disease states. Performed at [facility], [place] [address], [place], [place] [address]   Glucose, capillary     Status: Abnormal   Collection Time: 08/06/21 11:58 AM  Result Value Ref Range   Glucose-Capillary 265 (H) 70 - 99 mg/dL    Comment: Glucose reference range applies only to samples taken after fasting for at least 8 hours.  Glucose, capillary     Status: Abnormal   Collection Time: 08/06/21  3:54 PM  Result Value Ref Range   Glucose-Capillary 230 (H) 70 - 99 mg/dL    Comment: Glucose reference range applies only to samples taken after fasting for at least 8 hours.  Glucose, capillary     Status: Abnormal   Collection Time: 08/06/21  9:21 PM  Result Value Ref Range   Glucose-Capillary 196 (H) 70 - 99 mg/dL    Comment: Glucose reference range applies only to samples taken after fasting for at least 8 hours.  Renal function panel     Status: Abnormal   Collection Time: 08/07/21  2:03 AM  Result Value Ref Range   Sodium 134 (L) 135 - 145 mmol/L   Potassium 3.9 3.5 - 5.1 mmol/L   Chloride 102 98 - 111 mmol/L   CO2 24 22 - 32 mmol/L   Glucose, Bld 385 (H) 70 - 99 mg/dL    Comment: Glucose reference range applies only to samples taken after fasting for at least 8 hours.   BUN 14 6 - 20 mg/dL   Creatinine, Ser 0.95 0.44 - 1.00 mg/dL   Calcium 8.8 (L) 8.9 - 10.3 mg/dL   Phosphorus 4.0 2.5 - 4.6 mg/dL   Albumin 2.9 (L) 3.5 - 5.0 g/dL   GFR, Estimated >60 >60 mL/min    Comment: (NOTE) Calculated using the CKD-EPI Creatinine Equation (2021)    Anion gap 8 5 - 15    Comment: Performed at [facility] [address] [place], [place] [address]  Magnesium     Status: None   Collection Time: 08/07/21  2:03 AM  Result Value Ref Range   Magnesium  1.8 1.7 - 2.4 mg/dL    Comment: Performed at [facility], [place] [address], [place], [place] [address]  CK     Status: None   Collection Time: 08/07/21  2:03 AM  Result Value Ref Range   Total CK 47 38 - 234 U/L    Comment: Performed at [facility], [place] [address] [place], [place] [address]  CBC     Status: Abnormal   Collection Time: 08/07/21  2:03 AM  Result Value Ref Range   WBC 8.1 4.0 - 10.5 K/uL   RBC 4.19 3.87 - 5.11 MIL/uL   Hemoglobin 12.4 12.0 - 15.0 g/dL   HCT 35.9 (L) 36.0 - 46.0 %   MCV 85.7 80.0 - 100.0 fL   MCH 29.6 26.0 - 34.0 pg   MCHC 34.5 30.0 - 36.0  g/dL   RDW 12.1 11.5 - 15.5 %   Platelets 224 150 - 400 K/uL   nRBC 0.0 0.0 - 0.2 %    Comment: Performed at [facility], [place] [address], [place], [place] [address]  Lipid panel     Status: Abnormal   Collection Time: 08/07/21  2:03 AM  Result Value Ref Range   Cholesterol 280 (H) 0 - 200 mg/dL   Triglycerides 219 (H) <150 mg/dL   HDL 32 (L) >40 mg/dL   Total CHOL/HDL Ratio 8.8 RATIO   VLDL 44 (H) 0 - 40 mg/dL   LDL Cholesterol 204 (H) 0 - 99 mg/dL    Comment:        Total Cholesterol/HDL:CHD Risk Coronary Heart Disease Risk Table                     Men   Women  1/2 Average Risk   3.4   3.3  Average Risk       5.0   4.4  2 X Average Risk   9.6   7.1  3 X Average Risk  23.4   11.0        Use the calculated Patient Ratio above and the CHD Risk Table to determine the patient's CHD Risk.        ATP III CLASSIFICATION (LDL):  <100     mg/dL   Optimal  100-129  mg/dL   Near or Above                    Optimal  130-159  mg/dL   Borderline  160-189  mg/dL   High  >190     mg/dL   Very High Performed at [facility] [address], [place], [place] [address]   Glucose, capillary     Status: Abnormal   Collection Time: 08/07/21  6:22 AM  Result Value Ref Range   Glucose-Capillary 283 (H) 70 - 99 mg/dL    Comment: Glucose reference range applies only to samples taken after  fasting for at least 8 hours.  Glucose, capillary     Status: Abnormal   Collection Time: 08/07/21  9:23 AM  Result Value Ref Range   Glucose-Capillary 246 (H) 70 - 99 mg/dL    Comment: Glucose reference range applies only to samples taken after fasting for at least 8 hours.    Current Facility-Administered Medications  Medication Dose Route Frequency Provider Last Rate Last Admin   acetaminophen (TYLENOL) tablet 650 mg  650 mg Oral Q6H PRN Irene Pap N, DO   650 mg at 08/07/21 1025   atorvastatin (LIPITOR) tablet 40 mg  40 mg Oral Daily Gonfa, Taye T, MD   40 mg at 08/07/21 1007   busPIRone (BUSPAR) tablet 5 mg  5 mg Oral TID Suella Broad, FNP   5 mg at 08/07/21 1022   enoxaparin (LOVENOX) injection 40 mg  40 mg Subcutaneous Q24H Wendee Beavers T, MD   40 mg at 08/06/21 2136   insulin aspart (novoLOG) injection 0-15 Units  0-15 Units Subcutaneous TID WC Gonfa, Taye T, MD   8 Units at 08/07/21 0630   insulin aspart (novoLOG) injection 0-5 Units  0-5 Units Subcutaneous QHS Gonfa, Taye T, MD       insulin aspart (novoLOG) injection 6 Units  6 Units Subcutaneous TID WC Mercy Riding, MD   6 Units at 08/07/21 1006   insulin glargine-yfgn (SEMGLEE) injection  15 Units  15 Units Subcutaneous BID Wendee Beavers T, MD   15 Units at 08/07/21 1007   methocarbamol (ROBAXIN) tablet 500 mg  500 mg Oral Q8H PRN Mercy Riding, MD       Oral care mouth rinse  15 mL Mouth Rinse PRN [facility], McAdmits, MD       sertraline (ZOLOFT) tablet 25 mg  25 mg Oral Daily Suella Broad, FNP   25 mg at 08/07/21 1022    Musculoskeletal: Strength & Muscle Tone: within normal limits Gait & Station: normal Patient leans: N/A            Psychiatric Specialty Exam:  Presentation  General Appearance: Appropriate for Environment; Disheveled  Eye Contact:Fair  Speech:Clear and Coherent; Normal Rate  Speech Volume:Normal  Handedness:Right   Mood and Affect  Mood:Depressed;  Anxious  Affect:Depressed   Thought Process  Thought Processes:Linear; Coherent  Descriptions of Associations:Intact  Orientation:Full (Time, Place and Person)  Thought Content:Logical  History of Schizophrenia/Schizoaffective disorder:No data recorded Duration of Psychotic Symptoms:No data recorded Hallucinations:Hallucinations: None  Ideas of Reference:None  Suicidal Thoughts:Suicidal Thoughts: No  Homicidal Thoughts:Homicidal Thoughts: No   Sensorium  Memory:Immediate Good; Recent Good; Remote Good  Judgment:Fair  Insight:Fair   Executive Functions  Concentration:Fair  Attention Span:Good  [place] of Knowledge:Good  Language:Good   Psychomotor Activity  Psychomotor Activity:Psychomotor Activity: Normal   Assets  Assets:Communication Skills; Financial Resources/Insurance   Sleep  Sleep:Sleep: Fair   Physical Exam: Physical Exam ROS Blood pressure 122/65, pulse 71, temperature 97.9 F (36.6 C), resp. rate 17, height '5\' 10"'$  (1.778 m), weight 90.7 kg, last menstrual period 04/29/2010, SpO2 99 %. Body mass index is 28.7 kg/m.  Treatment Plan Summary: Daily contact with patient to assess and evaluate symptoms and progress in treatment, Medication management, and Plan Will start zoloft '25mg'$  po daily x 2 days for depression.   WIll start Buspar '5mg'$  po TID for anxiety   Disposition: No evidence of imminent risk to self or others at present.   Patient does not meet criteria for psychiatric inpatient admission. Supportive therapy provided about ongoing stressors. Refer to IOP. Discussed crisis plan, support from social network, calling 911, coming to the Emergency Department, and calling Suicide Hotline.  Suella Broad, FNP 08/07/2021 12:16 PM

## 2021-08-07 NOTE — Progress Notes (Signed)
Physical Therapy Treatment Patient Details Name: Brianna Wood MRN: [id number] DOB: [birth year] Today's Date: 08/07/2021   History of Present Illness 56 y.o. female presents to [facility] hospital on 08/05/2021 with confusion. Pt reports little sleep since March along with multiple syncopal episodes. EEG WNL. PMH includes anxiety, depression, somatization disorder, DMII, diabetic polyneuropathy, HLD, R CVA, DVT.    PT Comments    Pt continues to have anxiety about ambulating with therapy. Requests not to use AD as she has negative association from prior CVA disability causing her to walk with RW for 18 weeks. Had pt walk with [facility] however only able to walk 12 feet before onset of R sided headache, anxiety and closing in feeling. Performed MMT and pt with inconsistent presentation of weakness, suggesting less likely to be a physical weakness. Pt anxiety is limiting her ability to safely mobilize and pt suggests maybe she could stay with her friend [name] when she discharges. PT endorses that would be a good idea.    Recommendations for follow up therapy are one component of a multi-disciplinary discharge planning process, led by the attending physician.  Recommendations may be updated based on patient status, additional functional criteria and insurance authorization.  Follow Up Recommendations  Home health PT     Assistance Recommended at Discharge Intermittent Supervision/Assistance  Patient can return home with the following A little help with walking and/or transfers;A little help with bathing/dressing/bathroom;Assistance with cooking/housework;Assist for transportation;Help with stairs or ramp for entrance   Equipment Recommendations  Rolling walker (2 wheels) (may progress to no needs)       Precautions / Restrictions Precautions Precautions: Fall Precaution Comments: dizziness, HA Restrictions Weight Bearing Restrictions: No     Mobility  Bed Mobility Overal bed mobility: Modified  Independent                  Transfers Overall transfer level: Needs assistance Equipment used: None Transfers: Sit to/from Stand Sit to Stand: Min guard                Ambulation/Gait Ambulation/Gait assistance: Min guard, Min assist Gait Distance (Feet): 12 Feet Assistive device: Straight cane Gait Pattern/deviations: Step-to pattern Gait velocity: reduced     General Gait Details: pt with slowed step-to gait, reduced step-length bilaterally, strong grip on SPC         Balance Overall balance assessment: Needs assistance Sitting-balance support: No upper extremity supported, Feet supported Sitting balance-Leahy Scale: Good     Standing balance support: No upper extremity supported, During functional activity Standing balance-Leahy Scale: Good Standing balance comment: washes hands at sink without UE support                            Cognition Arousal/Alertness: Awake/alert Behavior During Therapy: Anxious (expresses a fear of falling) Overall Cognitive Status: Within Functional Limits for tasks assessed                                             General Comments General comments (skin integrity, edema, etc.): VSS on RA, does not want to use AD as she has negative association from prior medical difficulty that caused her to need RW for 18 months, was able to have her use SPC with improved steadiness.      Pertinent Vitals/Pain Pain Assessment Pain Assessment: Faces Faces Pain  Scale: Hurts little more Pain Location: head Pain Descriptors / Indicators: Pressure Pain Intervention(s): Repositioned     PT Goals (current goals can now be found in the care plan section) Acute Rehab PT Goals Patient Stated Goal: To reduce falls risk and dizziness PT Goal Formulation: With patient Time For Goal Achievement: 08/20/21 Potential to Achieve Goals: Good Progress towards PT goals: Not progressing toward goals - comment     Frequency    Min 3X/week      PT Plan Current plan remains appropriate       AM-PAC PT "6 Clicks" Mobility   Outcome Measure  Help needed turning from your back to your side while in a flat bed without using bedrails?: None Help needed moving from lying on your back to sitting on the side of a flat bed without using bedrails?: None Help needed moving to and from a bed to a chair (including a wheelchair)?: A Little Help needed standing up from a chair using your arms (e.g., wheelchair or bedside chair)?: A Little Help needed to walk in hospital room?: A Little Help needed climbing 3-5 steps with a railing? : A Lot 6 Click Score: 19    End of Session Equipment Utilized During Treatment: Gait belt Activity Tolerance: Patient limited by fatigue Patient left: in bed;with call bell/phone within reach;with bed alarm set Nurse Communication: Mobility status PT Visit Diagnosis: Other abnormalities of gait and mobility (R26.89);Muscle weakness (generalized) (M62.81)     Time: 8889-1694 PT Time Calculation (min) (ACUTE ONLY): 17 min  Charges:  $Therapeutic Activity: 8-22 mins                     Salvatore Poe B. Migdalia Dk PT, DPT Acute Rehabilitation Services Please use secure chat or  Call Office [phone]    [place] 08/07/2021, 3:28 PM

## 2021-08-07 NOTE — Inpatient Diabetes Management (Signed)
Attempted to see patient about her diabetes. Patient was with physical therapy and was discussing her home situation and where she would be going after her discharge. Physical therapy was working with her walking and unsteadiness.   Will continue to monitor blood sugars while in the hospital.  Harvel Ricks RN BSN CDE Diabetes Coordinator Pager: [phone]  8am-5pm

## 2021-08-07 NOTE — TOC Transition Note (Signed)
Transition of Care [facility]) - CM/SW Discharge Note   Patient Details  Name: Brianna Wood MRN: [id number] Date of Birth: Mar 21, 1965  Transition of Care [facility]) CM/SW Contact:  Pollie Friar, RN Phone Number: 08/07/2021, 1:59 PM   Clinical Narrative:    Pt is discharging home with home health services through [facility]. No DME needs.   Pt lives at home alone. She states she has issues in the section 8 home with rats and mold. She states she has complained to landlord and section 8 but nothing has changed. CM has called section 8 housing and talked to Ms Tia Masker [phone]. She states she was not aware this was an ongoing issue and will have someone out to her home ASAP to inspect. CM has provided the patient with Ms Tia Masker number so she can call and arrange this.  CM also added SW to her [facility] so that she will have more support after d/c.   Pt without a PCP. CM arranged an appointment for the patient for Dr Emilee Hero Bonsu. Information on the AVS.   Pt drives self. She manages her own medications at home.  Pt has transportation home.    Final next level of care: Home w Home Health Services Barriers to Discharge: No Barriers Identified   Patient Goals and CMS Choice   CMS Medicare.gov Compare Post Acute Care list provided to:: Patient Choice offered to / list presented to : Patient  Discharge Placement                       Discharge Plan and Services                          HH Arranged: PT, Social Work [facility] Agency: [place] Date [place]: 08/07/21   Representative spoke with at [place]: [place] Interventions     Readmission Risk Interventions     No data to display

## 2021-08-07 NOTE — Evaluation (Signed)
Occupational Therapy Evaluation Patient Details Name: Brianna Wood MRN: [id number] DOB: 1965/02/07 Today's Date: 08/07/2021   History of Present Illness 56 y.o. female presents to [facility] hospital on 08/05/2021 with confusion. Pt reports little sleep since March along with multiple syncopal episodes. EEG WNL. PMH includes anxiety, depression, somatization disorder, DMII, diabetic polyneuropathy, HLD, R CVA, DVT.   Clinical Impression   Pt admitted for concerns listed above. PTA Pt reported that she is independent with ADL's and functional mobility, however has had frequent falls. At this time, pt able to complete ADL's with mod I and functional mobility with up to min guard. She reported that she feels like a white wall is coming down when she is ambulating, which made her unstable and had to sit down. Pt then stood back up and completed tasks with no difficulty. Pt very concerned with black outs and OT provided compensatory strategies to assist with pt safety once home. OT will follow acutely.       Recommendations for follow up therapy are one component of a multi-disciplinary discharge planning process, led by the attending physician.  Recommendations may be updated based on patient status, additional functional criteria and insurance authorization.   Follow Up Recommendations  No OT follow up    Assistance Recommended at Discharge PRN  Patient can return home with the following A little help with walking and/or transfers;A little help with bathing/dressing/bathroom;Assistance with cooking/housework    Functional Status Assessment  Patient has had a recent decline in their functional status and demonstrates the ability to make significant improvements in function in a reasonable and predictable amount of time.  Equipment Recommendations  None recommended by OT    Recommendations for Other Services       Precautions / Restrictions Precautions Precautions: Fall Precaution Comments:  dizziness, HA Restrictions Weight Bearing Restrictions: No      Mobility Bed Mobility Overal bed mobility: Modified Independent                  Transfers Overall transfer level: Needs assistance Equipment used: None Transfers: Sit to/from Stand Sit to Stand: Min guard           General transfer comment: Pt refused RW      Balance Overall balance assessment: Needs assistance Sitting-balance support: No upper extremity supported, Feet supported Sitting balance-Leahy Scale: Good     Standing balance support: No upper extremity supported, During functional activity Standing balance-Leahy Scale: Good Standing balance comment: toileted with no assist                           ADL either performed or assessed with clinical judgement   ADL Overall ADL's : Modified independent                                       General ADL Comments: Pt able to complete ADLs with increased time for safety. OT provided compensatory strategies for when pt feels dizzy at home and how to improve her safety.     Vision Baseline Vision/History: 0 No visual deficits Ability to See in Adequate Light: 0 Adequate Patient Visual Report: No change from baseline Vision Assessment?: No apparent visual deficits     Perception     Praxis      Pertinent Vitals/Pain Pain Assessment Pain Assessment: Faces Faces Pain Scale: Hurts little more Pain Location:  head Pain Descriptors / Indicators: Pressure Pain Intervention(s): Monitored during session     Hand Dominance Right   Extremity/Trunk Assessment Upper Extremity Assessment Upper Extremity Assessment: Generalized weakness   Lower Extremity Assessment Lower Extremity Assessment: Generalized weakness   Cervical / Trunk Assessment Cervical / Trunk Assessment: Normal   Communication Communication Communication: No difficulties   Cognition Arousal/Alertness: Awake/alert Behavior During Therapy:  Anxious Overall Cognitive Status: Within Functional Limits for tasks assessed                                       General Comments  VSS on RA    Exercises     Shoulder Instructions      Home Living Family/patient expects to be discharged to:: Private residence Living Arrangements: Alone Available Help at Discharge: Family;Available PRN/intermittently Type of Home: House Home Access: Stairs to enter [employer] of Steps: 5 Entrance Stairs-Rails: Right;Left Home Layout: One level     Bathroom Shower/Tub: Teacher, early years/pre: Standard     Home Equipment: None          Prior Functioning/Environment Prior Level of Function : Independent/Modified Independent;Working/employed;Driving             Mobility Comments: working at [employer], recently restarted work after a year off. Had previously stopped working due to frequent falls and a fear of falling ADLs Comments: reports indep        OT Problem List: Decreased strength;Decreased activity tolerance;Impaired balance (sitting and/or standing);Impaired vision/perception      OT Treatment/Interventions:      OT Goals(Current goals can be found in the care plan section) Acute Rehab OT Goals Patient Stated Goal: To figure out why she is blacking out OT Goal Formulation: With patient Time For Goal Achievement: 08/07/21 Potential to Achieve Goals: Good  OT Frequency:      Co-evaluation              AM-PAC OT "6 Clicks" Daily Activity     Outcome Measure Help from another person eating meals?: None Help from another person taking care of personal grooming?: None Help from another person toileting, which includes using toliet, bedpan, or urinal?: None Help from another person bathing (including washing, rinsing, drying)?: None Help from another person to put on and taking off regular upper body clothing?: None Help from another person to put on and taking off regular  lower body clothing?: None 6 Click Score: 24   End of Session Nurse Communication: Mobility status  Activity Tolerance: Patient tolerated treatment well Patient left: in chair;with call bell/phone within reach;Other (comment) (PT in room)  OT Visit Diagnosis: Unsteadiness on feet (R26.81);Other abnormalities of gait and mobility (R26.89);Muscle weakness (generalized) (M62.81)                Time: 0938-1829 OT Time Calculation (min): 14 min Charges:  OT General Charges $OT Visit: 1 Visit OT Evaluation $OT Eval Low Complexity: [place]., OTR/L Acute Rehabilitation  Pace Lamadrid Elane Yolanda Bonine 08/07/2021, 6:14 PM

## 2021-08-07 NOTE — Discharge Summary (Signed)
Physician Discharge Summary  Brianna Wood TKW:[id number] DOB: 04-17-1965 DOA: 08/05/2021  PCP: Dineen Kid, MD  Admit date: 08/05/2021 Discharge date: 08/07/2021 Admitted From: Home Disposition: Home Recommendations for Outpatient Follow-up:  Follow up with PCP in 1 week Outpatient follow-up with psychiatry as soon as possible Check BMP and CBC at follow-up Please follow up on the following pending results: None  Home Health: HH PT. Equipment/Devices: Rolling walker  Discharge Condition: Stable CODE STATUS: Full code  Follow-up Information     Benito Mccreedy, MD Follow up on 08/11/2021.   Specialty: Internal Medicine Why: Your appointment is at 2:30 pm. Please arrive early and bring a picture ID, current medications and insurance card. Contact information: [address] [place] [address] [phone]         [facility] Follow up.   Why: The home health agency will contact you for the first home visit. Contact information: [phone]        [facility] Schedule an appointment as soon as possible for a visit.   Contact information: [address] [place] [address] [phone]         [facility] [place]. Schedule an appointment as soon as possible for a visit.   Why: this is another option for counseling Contact information: [address] [place] [address] [phone]                 Hospital course 56 year old F with PMH of anxiety, panic attacks, "somatization disorder" DM-2 with neuropathy, HLD, right-sided CVA, history of DVT and noncompliance with medication presenting with what she calls "blacking out" that lasted for her second but without collapse.  Reportedly, moved into new rental house infested with rats and mold in March 2023, and she has not been able to sleep, overwhelmed and confused.  She reports not taking him medications for months.  Does not recall  taking warfarin or history of DVT or blood clot.  Also stopped taking her anxiety medicine.    In ED, vital signs stable. Na 132. Cr 1.13.  Glucose 390.  VBG, CBC with differential without significant finding.  Troponin negative.  UA with > 500 glucose.  UDS negative.  Twelve-lead EKG NSR with nonspecific T wave changes.  CXR without acute finding.  MRI brain and CT angio head and neck ordered.  Psychiatry consulted, and patient was admitted with working diagnosis of acute metabolic encephalopathy.   MRI brain without acute finding but concerning for chronic small vessel ischemic disease. TSH, CRP, ESR, B12, EEG and orthostatic vitals without significant finding.  Discussed with on-call neurologist, who did not think additional inpatient work-up was needed, and recommended outpatient follow-up.   On further questioning, patient reported fall and hitting her head about 2 weeks prior when she was walking out of shower.  She states she did not go back to work for 9 days after that.  She says she has headache and intermittently blacks out since then.  Patient was evaluated by psychiatry who recommended Zoloft 25 mg daily and BuSpar 5 mg 3 times daily.  She was cleared for discharge by psychiatry for outpatient follow-up.  Patient was evaluated by therapy who performed MMT and noticed inconsistent presentation of weakness, suggesting less likely to be a physical weakness. Pt anxiety is limiting her ability to safely mobilize and pt suggests maybe she could stay with her friend [name] when she discharges.  Home health PT  and DME were ordered.  Patient has uncontrolled diabetes due to not taking medications.  A1c 14.2%.  Since she was on glipizide and metformin at some point.  She is discharged on metformin 1000 mg twice daily and glipizide 10 mg twice daily.  She was also started on Lipitor.  Patient's medications filled at [facility] and delivered to bedside prior to discharge.  TOC contacted [place]  housing to to inspect patient's house.  See individual problem list below for more.   Problems addressed during this hospitalization Principal Problem:   LOC (loss of consciousness) ([place]) Active Problems:   Confusion   Anxiety and depression   Uncontrolled type 2 diabetes mellitus with diabetic polyneuropathy, with long-term current use of insulin   Chronic pain syndrome   Essential hypertension   History of noncompliance with medical treatment   Late effects of CVA (cerebrovascular accident)   Right-sided cerebrovascular accident (CVA) ([place])   Loss of consciousness?  Work-up unrevealing.  This was not witnessed in the hospital.  Concern about underlying psych issue limiting her physical ability.  Also concern about concussion given report of recent fall and head trauma.  Discussed about concussion management.  Resume home medication for underlying anxiety and depression per psychiatry recommendation.  Anxiety/depression/history of panic attack: Evaluated by psychiatry and cleared for discharge on Zoloft and BuSpar.   Uncontrolled diabetes with hyperglycemia and hyperlipidemia: A1c 14.2%.  Supposed to be on glipizide and metformin but has not taken medications in months. -Discharged metformin, glipizide and Lipitor.   History of right CVA: Stable. -Manage risk factors as above.   Left thigh pain/LLE weakness: She has LLE weakness and pain with hip flexion.  Seems to have resolved.   Essential hypertension: Blood pressure within acceptable range.   History of DVT: Patient does not recall this or taking warfarin.  INR 1.0.            Vital signs Vitals:   08/06/21 2025 08/07/21 0004 08/07/21 0437 08/07/21 0915  BP: 121/74 126/72 128/82 122/65  Pulse: 72 71 65 71  Temp: 97.7 F (36.5 C) 97.8 F (36.6 C) 98 F (36.7 C) 97.9 F (36.6 C)  Resp: 17 16  17   Height:      Weight:      SpO2: 98% 97% 98% 99%  TempSrc: Oral Oral Oral   BMI (Calculated):         Discharge  exam  GENERAL: No apparent distress.  Nontoxic. HEENT: MMM.  Vision and hearing grossly intact.  NECK: Supple.  No apparent JVD.  RESP:  No IWOB.  Fair aeration bilaterally. CVS:  RRR. Heart sounds normal.  ABD/GI/GU: BS+. Abd soft, NTND.  MSK/EXT:  Moves extremities. No apparent deformity. No edema.  SKIN: no apparent skin lesion or wound NEURO: Awake and alert. Oriented appropriately.  No apparent focal neuro deficit other than global weakness but inconsistent exam PSYCH: Somewhat anxious.  Discharge Instructions Discharge Instructions     Call MD for:  difficulty breathing, headache or visual disturbances   Complete by: As directed    Call MD for:  extreme fatigue   Complete by: As directed    Call MD for:  persistant dizziness or light-headedness   Complete by: As directed    Call MD for:  persistant nausea and vomiting   Complete by: As directed    Call MD for:  severe uncontrolled pain   Complete by: As directed    Diet - low sodium heart healthy  Complete by: As directed    Diet Carb Modified   Complete by: As directed    Discharge instructions   Complete by: As directed    It has been a pleasure taking care of you!  You were hospitalized due to "blacking out".  It is unclear what is causing your symptoms.  We have run multiple tests including MRI of your brain and EEG that did not give explanation.  Your symptoms could be as a result of concussion.  The treatment for concussion is complete rest including physical and mental rest until your symptoms go away.  You may take Tylenol as needed for your headache.  We have also noted that your diabetes is out of control.  We have started you on diabetic medication and cholesterol medication.  It is very important that you take these medications as prescribed.  We have also started you on Zoloft and BuSpar for you anxiety and depression per recommendation by psychiatry.  Please review your new medication list and the directions  on your medications before you take them.  Follow-up with your primary care doctor and psychiatrist in 1 to 2 weeks or sooner if needed.   Take care,   Increase activity slowly   Complete by: As directed       Allergies as of 08/07/2021       Reactions   Nickel Rash   Aspirin    Latex Itching, Swelling   Wheat Extract Swelling        Medication List     STOP taking these medications    COUMADIN PO   glimepiride 4 MG tablet Commonly known as: AMARYL   metFORMIN 500 MG (MOD) 24 hr tablet Commonly known as: GLUMETZA Replaced by: metFORMIN 1000 MG tablet       TAKE these medications    acetaminophen 325 MG tablet Commonly known as: Tylenol Take 2 tablets (650 mg total) by mouth every 4 (four) hours as needed. What changed:  medication strength how much to take when to take this reasons to take this   atorvastatin 40 MG tablet Commonly known as: LIPITOR Take 1 tablet (40 mg total) by mouth daily. Start taking on: August 08, 2021   busPIRone 5 MG tablet Commonly known as: BUSPAR Take 1 tablet (5 mg total) by mouth 3 (three) times daily. What changed:  medication strength how much to take when to take this reasons to take this   furosemide 20 MG tablet Commonly known as: LASIX Take 1 tablet (20 mg total) by mouth daily.   glipiZIDE 10 MG tablet Commonly known as: Glucotrol Take 1 tablet (10 mg total) by mouth daily. What changed:  medication strength how much to take when to take this   metFORMIN 1000 MG tablet Commonly known as: GLUCOPHAGE Take 1 tablet (1,000 mg total) by mouth 2 (two) times daily with a meal. Replaces: metFORMIN 500 MG (MOD) 24 hr tablet   multivitamin with minerals tablet Take 1 tablet by mouth daily.   sertraline 25 MG tablet Commonly known as: ZOLOFT Take 1 tablet (25 mg total) by mouth daily. Start taking on: August 08, 2021   traMADol 50 MG tablet Commonly known as: ULTRAM Take 1 tablet (50 mg total) by mouth  every 6 (six) hours as needed.        Consultations: Neurology over the phone Psychiatry  Procedures/Studies:   EEG adult  Result Date: 08/06/2021 Lora Havens, MD     08/06/2021  3:17 PM Patient  Name: MATASHA SMIGELSKI MRN: [id number] Epilepsy Attending: Lora Havens Referring Physician/Provider: Mercy Riding, MD Date: 08/06/2021 Duration: 22.08 mins Patient history: 56yo F with ams. EEG to evaluate for seizure Level of alertness: Awake, asleep AEDs during EEG study: None Technical aspects: This EEG study was done with scalp electrodes positioned according to the 10-20 International system of electrode placement. Electrical activity was acquired at a sampling rate of 500Hz  and reviewed with a high frequency filter of 70Hz  and a low frequency filter of 1Hz . EEG data were recorded continuously and digitally stored. Description: The posterior dominant rhythm consists of 9 Hz activity of moderate voltage (25-35 uV) seen predominantly in posterior head regions, symmetric and reactive to eye opening and eye closing. Sleep was characterized by vertex waves, sleep spindles (12 to 14 Hz), maximal frontocentral region. Hyperventilation and photic stimulation were not performed.   IMPRESSION: This study is within normal limits. No seizures or epileptiform discharges were seen throughout the recording. Lora Havens   MR BRAIN WO CONTRAST  Result Date: 08/06/2021 CLINICAL DATA:  Provided history: Delirium, confusion. Additional history provided: History of anxiety/depression, somatization disorder, type 2 diabetes, diabetic polyneuropathy, hyperlipidemia, history of right-sided CVA, history of DVT on Coumadin. EXAM: MRI HEAD WITHOUT CONTRAST TECHNIQUE: Multiplanar, multiecho pulse sequences of the brain and surrounding structures were obtained without intravenous contrast. COMPARISON:  Noncontrast head CT and CT angiogram head/neck 07/06/2021. FINDINGS: Brain: No age advanced or lobar predominant  parenchymal atrophy. Multifocal T2 FLAIR hyperintense signal abnormality within the cerebral white matter, overall mild but advanced for age. Findings are nonspecific, but likely reflect chronic small vessel ischemic disease given the patient's history of diabetes and hyperlipidemia. Small chronic infarcts within the left cerebellar hemisphere. There is no acute infarct. No evidence of an intracranial mass. No chronic intracranial blood products. No extra-axial fluid collection. No midline shift. Vascular: Maintained flow voids within the proximal large arterial vessels. Skull and upper cervical spine: No focal suspicious marrow lesion. Sinuses/Orbits: No mass or acute finding within the imaged orbits. Minimal mucosal thickening within the bilateral ethmoid and maxillary sinuses. IMPRESSION: 1. No evidence of acute intracranial abnormality. 2. Multifocal T2 FLAIR hyperintense signal abnormality within the cerebral white matter, overall mild but advanced for age. Findings are nonspecific, but likely reflect chronic small vessel ischemic disease given the patient's history of diabetes and hyperlipidemia. 3. Small chronic infarcts within the left cerebellar hemisphere. 4. Minimal mucosal thickening within the bilateral ethmoid and maxillary sinuses. Electronically Signed   By: Kellie Simmering D.O.   On: 08/06/2021 11:30   DG Abd Portable 1V  Result Date: 08/06/2021 CLINICAL DATA:  Middle implant EXAM: PORTABLE ABDOMEN - 1 VIEW COMPARISON:  None Available. FINDINGS: The bowel gas pattern is normal. No radio-opaque calculi or other significant radiographic abnormality are seen. No metallic foreign body seen in the abdomen or pelvis. IMPRESSION: No metallic foreign body seen in the abdomen or pelvis. Electronically Signed   By: Yetta Glassman M.D.   On: 08/06/2021 09:51   DG Chest Portable 1 View  Result Date: 08/05/2021 CLINICAL DATA:  Chest pain EXAM: PORTABLE CHEST 1 VIEW COMPARISON:  05/29/2015 FINDINGS: The  heart size and mediastinal contours are within normal limits. Both lungs are clear. The visualized skeletal structures are unremarkable. IMPRESSION: No active disease. Electronically Signed   By: Elmer Picker M.D.   On: 08/05/2021 16:34   CT ANGIO HEAD NECK W WO CM  Result Date: 08/05/2021 CLINICAL DATA:  Numbness or tingling, paresthesia  EXAM: CT ANGIOGRAPHY HEAD AND NECK TECHNIQUE: Multidetector CT imaging of the head and neck was performed using the standard protocol during bolus administration of intravenous contrast. Multiplanar CT image reconstructions and MIPs were obtained to evaluate the vascular anatomy. Carotid stenosis measurements (when applicable) are obtained utilizing NASCET criteria, using the distal internal carotid diameter as the denominator. RADIATION DOSE REDUCTION: This exam was performed according to the departmental dose-optimization program which includes automated exposure control, adjustment of the mA and/or kV according to patient size and/or use of iterative reconstruction technique. CONTRAST:  167m OMNIPAQUE IOHEXOL 350 MG/ML SOLN COMPARISON:  2017 FINDINGS: CT HEAD Brain: There is no acute intracranial hemorrhage, mass effect, or edema. Gray-white differentiation is preserved. There is no extra-axial fluid collection. Ventricles and sulci are within normal limits in size and configuration. Age-indeterminate but probably chronic small left cerebellar infarct Vascular: Better evaluated on CTA portion. Skull: Calvarium is unremarkable. Sinuses/Orbits: No acute finding. Other: None. Review of the MIP images confirms the above findings CTA NECK Aortic arch: Minimal calcified plaque. Great vessel origins are patent. Right carotid system: Patent. Mild eccentric noncalcified plaque at the ICA origin without stenosis. Left carotid system: Patent. Trace plaque at the ICA origin without stenosis. Vertebral arteries: Patent. Left vertebral is dominant. No stenosis. Skeleton: No acute  or significant osseous abnormality. Other neck: Unremarkable. Upper chest: No apical lung mass. Review of the MIP images confirms the above findings CTA HEAD Anterior circulation: Intracranial internal carotid arteries patent with mild plaque. Anterior and middle cerebral arteries are patent. Posterior circulation: Intracranial vertebral arteries are patent. Basilar artery is patent. Major cerebellar artery origins are patent. Bilateral posterior communicating arteries are present. Posterior cerebral arteries are patent. Venous sinuses: Patent as allowed by contrast bolus timing. Review of the MIP images confirms the above findings IMPRESSION: No acute intracranial abnormality. 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 No large vessel occlusion, hemodynamically significant stenosis, or evidence of dissection. Electronically Signed   By: PMacy MisM.D.   On: 08/05/2021 12:48       The results of significant diagnostics from this hospitalization (including imaging, microbiology, ancillary and laboratory) are listed below for reference.     Microbiology: No results found for this or any previous visit (from the past 240 hour(s)).   Labs:  CBC: Recent Labs  Lab 08/05/21 1059 08/05/21 1130 08/06/21 0440 08/07/21 0203  WBC 7.6  --  7.9 8.1  NEUTROABS 5.3  --   --   --   HGB 12.1 12.6 13.4 12.4  HCT 35.8* 37.0 37.1 35.9*  MCV 87.1  --  86.1 85.7  PLT 219  --  252 224   BMP &GFR Recent Labs  Lab 08/05/21 1059 08/05/21 1130 08/06/21 0440 08/07/21 0203  NA 132* 132*  --  134*  K 4.3 4.8  --  3.9  CL 102  --   --  102  CO2 25  --   --  24  GLUCOSE 390*  --   --  385*  BUN 15  --   --  14  CREATININE 0.90  --  0.74 0.95  CALCIUM 8.8*  --   --  8.8*  MG 1.9  --   --  1.8  PHOS  --   --   --  4.0   Estimated Creatinine Clearance: 82.7 mL/min (by C-G formula based on SCr of 0.95 mg/dL). Liver & Pancreas: Recent Labs  Lab 08/05/21 1059 08/07/21 0203   AST 17  --  ALT 16  --   ALKPHOS 113  --   BILITOT 0.6  --   PROT 6.5  --   ALBUMIN 3.4* 2.9*   No results for input(s): "LIPASE", "AMYLASE" in the last 168 hours. No results for input(s): "AMMONIA" in the last 168 hours. Diabetic: Recent Labs    08/05/21 1630  HGBA1C 14.2*   Recent Labs  Lab 08/06/21 1554 08/06/21 2121 08/07/21 0622 08/07/21 0923 08/07/21 1219  GLUCAP 230* 196* 283* 246* 202*   Cardiac Enzymes: Recent Labs  Lab 08/07/21 0203  CKTOTAL 47   No results for input(s): "PROBNP" in the last 8760 hours. Coagulation Profile: Recent Labs  Lab 08/06/21 0707  INR 1.0   Thyroid Function Tests: Recent Labs    08/06/21 0440  TSH 1.016   Lipid Profile: Recent Labs    08/07/21 0203  CHOL 280*  HDL 32*  LDLCALC 204*  TRIG 219*  CHOLHDL 8.8   Anemia Panel: Recent Labs    08/06/21 0440  VITAMINB12 284   Urine analysis:    Component Value Date/Time   COLORURINE YELLOW 08/05/2021 1204   APPEARANCEUR CLEAR 08/05/2021 1204   LABSPEC 1.010 08/05/2021 1204   PHURINE 5.5 08/05/2021 1204   GLUCOSEU >=500 (A) 08/05/2021 1204   HGBUR NEGATIVE 08/05/2021 [place] 08/05/2021 1204   KETONESUR NEGATIVE 08/05/2021 1204   PROTEINUR NEGATIVE 08/05/2021 1204   UROBILINOGEN 1.0 08/29/2010 1422   NITRITE NEGATIVE 08/05/2021 1204   LEUKOCYTESUR NEGATIVE 08/05/2021 1204   Sepsis Labs: Invalid input(s): "PROCALCITONIN", "LACTICIDVEN"   SIGNED:  Mercy Riding, MD  [facility] 08/07/2021, 6:53 PM

## 2021-08-16 ENCOUNTER — Emergency Department (HOSPITAL_BASED_OUTPATIENT_CLINIC_OR_DEPARTMENT_OTHER): Payer: Medicaid Other

## 2021-08-16 ENCOUNTER — Other Ambulatory Visit: Payer: Self-pay

## 2021-08-16 ENCOUNTER — Emergency Department (HOSPITAL_BASED_OUTPATIENT_CLINIC_OR_DEPARTMENT_OTHER)
Admission: EM | Admit: 2021-08-16 | Discharge: 2021-08-16 | Disposition: A | Discharge status: Home / Self Care | Payer: Medicaid Other | Attending: Emergency Medicine | Admitting: Emergency Medicine

## 2021-08-16 ENCOUNTER — Encounter (HOSPITAL_BASED_OUTPATIENT_CLINIC_OR_DEPARTMENT_OTHER): Payer: Self-pay | Admitting: Emergency Medicine

## 2021-08-16 DIAGNOSIS — E1140 Type 2 diabetes mellitus with diabetic neuropathy, unspecified: Secondary | ICD-10-CM | POA: Diagnosis not present

## 2021-08-16 DIAGNOSIS — R079 Chest pain, unspecified: Secondary | ICD-10-CM | POA: Insufficient documentation

## 2021-08-16 DIAGNOSIS — R55 Syncope and collapse: Secondary | ICD-10-CM | POA: Diagnosis present

## 2021-08-16 DIAGNOSIS — Z8673 Personal history of transient ischemic attack (TIA), and cerebral infarction without residual deficits: Secondary | ICD-10-CM | POA: Insufficient documentation

## 2021-08-16 DIAGNOSIS — Z86718 Personal history of other venous thrombosis and embolism: Secondary | ICD-10-CM | POA: Diagnosis not present

## 2021-08-16 DIAGNOSIS — Z7984 Long term (current) use of oral hypoglycemic drugs: Secondary | ICD-10-CM | POA: Insufficient documentation

## 2021-08-16 DIAGNOSIS — R42 Dizziness and giddiness: Secondary | ICD-10-CM | POA: Diagnosis not present

## 2021-08-16 LAB — BASIC METABOLIC PANEL
Anion gap: 6 (ref 5–15)
BUN: 14 mg/dL (ref 6–20)
CO2: 24 mmol/L (ref 22–32)
Calcium: 8.9 mg/dL (ref 8.9–10.3)
Chloride: 105 mmol/L (ref 98–111)
Creatinine, Ser: 0.77 mg/dL (ref 0.44–1.00)
GFR, Estimated: >60 mL/min (ref >60)
Glucose, Bld: 237 mg/dL — ABNORMAL HIGH (ref 70–99)
Potassium: 3.7 mmol/L (ref 3.5–5.1)
Sodium: 135 mmol/L (ref 135–145)

## 2021-08-16 LAB — CBC
HCT: 37.7 % (ref 36.0–46.0)
Hemoglobin: 12.9 g/dL (ref 12.0–15.0)
MCH: 29.6 pg (ref 26.0–34.0)
MCHC: 34.2 g/dL (ref 30.0–36.0)
MCV: 86.5 fL (ref 80.0–100.0)
Platelets: 274 10*3/uL (ref 150–400)
RBC: 4.36 MIL/uL (ref 3.87–5.11)
RDW: 12.2 % (ref 11.5–15.5)
WBC: 7.7 10*3/uL (ref 4.0–10.5)
nRBC: 0 % (ref 0.0–0.2)

## 2021-08-16 LAB — CBG MONITORING, ED: Glucose-Capillary: 238 mg/dL — ABNORMAL HIGH (ref 70–99)

## 2021-08-16 LAB — TROPONIN I (HIGH SENSITIVITY): Troponin I (High Sensitivity): 3 ng/L (ref <18)

## 2021-08-16 NOTE — Discharge Instructions (Signed)
Follow-up with your primary care doctor to discuss the symptoms that you are experiencing today.  If you have any further episodes of feeling like you are going to pass out or episodes of passing out or chest pain or difficulty in breathing, please return to ER for reassessment.

## 2021-08-16 NOTE — ED Notes (Signed)
Patient transported to X-ray 

## 2021-08-16 NOTE — ED Notes (Signed)
Pt discharged to home. Discharge instructions have been discussed with patient and/or family members. Pt verbally acknowledges understanding d/c instructions, and endorses comprehension to checkout at registration before leaving.  °

## 2021-08-16 NOTE — ED Provider Notes (Signed)
[facility] Provider Note   CSN: [id number] Arrival date & time: 08/16/21  1916     History  Chief Complaint  Patient presents with   Near Syncope   Chest Pain    Brianna Wood is a 56 y.o. female.  Presented respiratory due to concern for episode of near syncope.  Patient states that earlier today she had an episode where she felt lightheaded and felt like she was going to pass out but did not completely pass out.  She had some pain in her upper abdomen/possibly lower chest.  The pain resolved, this was not associated with exertion.  She had some shortness of breath around that time but she now has no shortness of breath.  She denies headache, numbness, weakness or speech or vision change.  No longer feeling lightheaded.  No further episodes of near syncope.  Completed chart review, reviewed discharge summary from Dr. Cyndia Skeeters on 08/07/2021 - 68 year old F with PMH of anxiety, panic attacks, "somatization disorder" DM-2 with neuropathy, HLD, right-sided CVA, history of DVT and noncompliance with medication presenting with what she calls "blacking out" that lasted for her second but without collapse  HPI     Home Medications Prior to Admission medications   Medication Sig Start Date End Date Taking? Authorizing Provider  acetaminophen (TYLENOL) 325 MG tablet Take 2 tablets (650 mg total) by mouth every 4 (four) hours as needed. 08/07/21 09/06/21  Mercy Riding, MD  atorvastatin (LIPITOR) 40 MG tablet Take 1 tablet (40 mg total) by mouth daily. 08/08/21 02/04/22  Mercy Riding, MD  busPIRone (BUSPAR) 5 MG tablet Take 1 tablet (5 mg total) by mouth 3 (three) times daily. 08/07/21 09/06/21  Mercy Riding, MD  furosemide (LASIX) 20 MG tablet Take 1 tablet (20 mg total) by mouth daily. Patient not taking: Reported on 08/07/2021 03/02/16   Drenda Freeze, MD  glipiZIDE (GLUCOTROL) 10 MG tablet Take 1 tablet (10 mg total) by mouth daily. 08/07/21 11/05/21  Mercy Riding, MD  metFORMIN (GLUCOPHAGE) 1000 MG tablet Take 1 tablet (1,000 mg total) by mouth 2 (two) times daily with a meal. 08/07/21   Mercy Riding, MD  Multiple Vitamins-Minerals (MULTIVITAMIN WITH MINERALS) tablet Take 1 tablet by mouth daily. 08/07/21 08/07/22  Mercy Riding, MD  sertraline (ZOLOFT) 25 MG tablet Take 1 tablet (25 mg total) by mouth daily. 08/08/21   Mercy Riding, MD  traMADol (ULTRAM) 50 MG tablet Take 1 tablet (50 mg total) by mouth every 6 (six) hours as needed. Patient not taking: Reported on 08/07/2021 03/02/16   Drenda Freeze, MD      Allergies    Nickel, Aspirin, Latex, and Wheat extract    Review of Systems   Review of Systems  Constitutional:  Positive for fatigue. Negative for chills and fever.  HENT:  Negative for ear pain and sore throat.   Eyes:  Negative for pain and visual disturbance.  Respiratory:  Negative for cough and shortness of breath.   Cardiovascular:  Positive for chest pain. Negative for palpitations.  Gastrointestinal:  Negative for abdominal pain and vomiting.  Genitourinary:  Negative for dysuria and hematuria.  Musculoskeletal:  Negative for arthralgias and back pain.  Skin:  Negative for color change and rash.  Neurological:  Positive for light-headedness. Negative for seizures and syncope.  All other systems reviewed and are negative.   Physical Exam Updated Vital Signs BP 140/79   Pulse 62  Temp 98.3 F (36.8 C) (Oral)   Resp 11   Ht '5\' 10"'$  (1.778 m)   Wt 92.1 kg   LMP 04/29/2010   SpO2 97%   BMI 29.13 kg/m  Physical Exam Vitals and nursing note reviewed.  Constitutional:      General: She is not in acute distress.    Appearance: She is well-developed.  HENT:     Head: Normocephalic and atraumatic.  Eyes:     Conjunctiva/sclera: Conjunctivae normal.  Cardiovascular:     Rate and Rhythm: Normal rate and regular rhythm.     Heart sounds: No murmur heard. Pulmonary:     Effort: Pulmonary effort is normal. No respiratory  distress.     Breath sounds: Normal breath sounds.  Chest:     Chest wall: No tenderness or crepitus.  Abdominal:     Palpations: Abdomen is soft.     Tenderness: There is no abdominal tenderness.  Musculoskeletal:        General: No swelling.     Cervical back: Neck supple.  Skin:    General: Skin is warm and dry.     Capillary Refill: Capillary refill takes less than 2 seconds.  Neurological:     Mental Status: She is alert.  Psychiatric:        Mood and Affect: Mood normal.     ED Results / Procedures / Treatments   Labs (all labs ordered are listed, but only abnormal results are displayed) Labs Reviewed  BASIC METABOLIC PANEL - Abnormal; Notable for the following components:      Result Value   Glucose, Bld 237 (*)    All other components within normal limits  CBG MONITORING, ED - Abnormal; Notable for the following components:   Glucose-Capillary 238 (*)    All other components within normal limits  CBC  URINALYSIS, ROUTINE W REFLEX MICROSCOPIC  TROPONIN I (HIGH SENSITIVITY)  TROPONIN I (HIGH SENSITIVITY)    EKG EKG Interpretation  Date/Time:  Saturday August 16 2021 19:35:40 EDT Ventricular Rate:  74 PR Interval:  166 QRS Duration: 80 QT Interval:  386 QTC Calculation: 428 R Axis:   20 Text Interpretation: Normal sinus rhythm Normal ECG When compared with ECG of 05-Aug-2021 10:31,  no change appreciated Confirmed by Madalyn Rob [phone]) on 08/16/2021 10:54:59 PM  Radiology DG Chest 2 View  Result Date: 08/16/2021 CLINICAL DATA:  Chest pain.  Intermittent left-sided chest pain. EXAM: CHEST - 2 VIEW COMPARISON:  Chest radiograph dated August 05, 2021 FINDINGS: The heart size and mediastinal contours are within normal limits. Both lungs are clear. The visualized skeletal structures are unremarkable. IMPRESSION: No active cardiopulmonary disease. Electronically Signed   By: Keane Police D.O.   On: 08/16/2021 21:39    Procedures Procedures    Medications  Ordered in ED Medications - No data to display  ED Course/ Medical Decision Making/ A&P                           Medical Decision Making Amount and/or Complexity of Data Reviewed Labs: ordered. Radiology: ordered.   56 year old presenting to the emergency department due to concern for episode of near syncope at home.  When she arrived here she was well-appearing and did not have any ongoing symptoms.  She was observed in the ER for couple hours on telemetry monitoring.  No events on review of her cardiac monitor, remained in sinus rhythm.  EKG demonstrated sinus rhythm without  acute ischemic change.  Her troponin is within normal limits, no ongoing chest pain, doubt ACS.  CXR intermittently reviewed and interpreted by myself, no pneumonia, no cardiomegaly.  Recently admitted for having multiple similar episodes.  Work-up during that admission was unrevealing.  She did not have any episodes witnessed while in hospital.  Given patient has no ongoing symptoms, normal vital signs, reassuring work-up today, feel she can be discharged and managed in the outpatient setting.  Advise she follow-up with her primary care doctor.  After the discussed management above, the patient was determined to be safe for discharge.  The patient was in agreement with this plan and all questions regarding their care were answered.  ED return precautions were discussed and the patient will return to the ED with any significant worsening of condition.       Final Clinical Impression(s) / ED Diagnoses Final diagnoses:  Near syncope    Rx / DC Orders ED Discharge Orders     None         Lucrezia Starch, MD 08/16/21 2257

## 2021-08-16 NOTE — ED Triage Notes (Signed)
Pt arrives pov, slow gait, c/o intermittent left side CP under left breast with shob and near syncope. Admitted for similar symptoms recently

## 2021-10-23 ENCOUNTER — Other Ambulatory Visit (HOSPITAL_BASED_OUTPATIENT_CLINIC_OR_DEPARTMENT_OTHER): Payer: Self-pay
